# Patient Record
Sex: Male | Born: 1935 | Race: Black or African American | Hispanic: No | Marital: Married | Smoking: Current every day smoker
Health system: Southern US, Community
[De-identification: ages and names within clinical notes are randomized; demographics above are authoritative.]

## PROBLEM LIST (undated history)

## (undated) DIAGNOSIS — T7840XA Allergy, unspecified, initial encounter: Secondary | ICD-10-CM

## (undated) DIAGNOSIS — C61 Malignant neoplasm of prostate: Secondary | ICD-10-CM

## (undated) DIAGNOSIS — C7951 Secondary malignant neoplasm of bone: Secondary | ICD-10-CM

## (undated) DIAGNOSIS — Z9221 Personal history of antineoplastic chemotherapy: Secondary | ICD-10-CM

## (undated) DIAGNOSIS — J45909 Unspecified asthma, uncomplicated: Secondary | ICD-10-CM

## (undated) DIAGNOSIS — Z923 Personal history of irradiation: Secondary | ICD-10-CM

## (undated) DIAGNOSIS — E785 Hyperlipidemia, unspecified: Secondary | ICD-10-CM

## (undated) HISTORY — DX: Personal history of antineoplastic chemotherapy: Z92.21

## (undated) HISTORY — DX: Allergy, unspecified, initial encounter: T78.40XA

## (undated) HISTORY — DX: Personal history of irradiation: Z92.3

## (undated) HISTORY — PX: CHOLECYSTECTOMY: SHX55

## (undated) HISTORY — DX: Secondary malignant neoplasm of bone: C79.51

## (undated) HISTORY — DX: Hyperlipidemia, unspecified: E78.5

## (undated) HISTORY — PX: CATARACT EXTRACTION: SUR2

## (undated) HISTORY — DX: Unspecified asthma, uncomplicated: J45.909

## (undated) HISTORY — DX: Malignant neoplasm of prostate: C61

---

## 1999-08-09 ENCOUNTER — Emergency Department (HOSPITAL_COMMUNITY): Admission: EM | Admit: 1999-08-09 | Discharge: 1999-08-09 | Payer: Self-pay | Admitting: *Deleted

## 1999-12-12 HISTORY — PX: PROSTATE BIOPSY: SHX241

## 1999-12-12 HISTORY — PX: PERINEAL PROSTATECTOMY: SUR1054

## 2000-10-05 ENCOUNTER — Encounter (INDEPENDENT_AMBULATORY_CARE_PROVIDER_SITE_OTHER): Payer: Self-pay | Admitting: Specialist

## 2000-10-05 ENCOUNTER — Other Ambulatory Visit: Admission: RE | Admit: 2000-10-05 | Discharge: 2000-10-05 | Payer: Self-pay | Admitting: Urology

## 2000-11-07 ENCOUNTER — Encounter: Payer: Self-pay | Admitting: Urology

## 2000-11-07 ENCOUNTER — Encounter: Admission: RE | Admit: 2000-11-07 | Discharge: 2000-11-07 | Payer: Self-pay | Admitting: Urology

## 2000-11-13 ENCOUNTER — Inpatient Hospital Stay (HOSPITAL_COMMUNITY): Admission: RE | Admit: 2000-11-13 | Discharge: 2000-11-17 | Payer: Self-pay | Admitting: Urology

## 2000-11-13 ENCOUNTER — Encounter (INDEPENDENT_AMBULATORY_CARE_PROVIDER_SITE_OTHER): Payer: Self-pay | Admitting: Specialist

## 2003-04-07 ENCOUNTER — Ambulatory Visit (HOSPITAL_COMMUNITY): Admission: RE | Admit: 2003-04-07 | Discharge: 2003-04-07 | Payer: Self-pay | Admitting: Gastroenterology

## 2006-01-28 ENCOUNTER — Emergency Department (HOSPITAL_COMMUNITY): Admission: EM | Admit: 2006-01-28 | Discharge: 2006-01-28 | Payer: Self-pay | Admitting: Emergency Medicine

## 2007-04-22 ENCOUNTER — Encounter: Admission: RE | Admit: 2007-04-22 | Discharge: 2007-04-22 | Payer: Self-pay | Admitting: Urology

## 2007-04-26 ENCOUNTER — Ambulatory Visit: Admission: RE | Admit: 2007-04-26 | Discharge: 2007-07-24 | Payer: Self-pay | Admitting: Radiation Oncology

## 2010-07-12 ENCOUNTER — Encounter: Admission: RE | Admit: 2010-07-12 | Discharge: 2010-07-12 | Payer: Self-pay | Admitting: Orthopedic Surgery

## 2010-08-24 ENCOUNTER — Ambulatory Visit (HOSPITAL_COMMUNITY): Admission: RE | Admit: 2010-08-24 | Discharge: 2010-08-24 | Payer: Self-pay | Admitting: Urology

## 2011-01-01 ENCOUNTER — Encounter: Payer: Self-pay | Admitting: Urology

## 2011-04-28 NOTE — Op Note (Signed)
[facility]  Patient:    Aaron Schaefer, Aaron Schaefer                      MRN: [id number] Proc. Date: 11/13/00 Adm. Date:  [id number] Attending:  Lindaann Slough CC:         Winn Jock. Earl Gala, M.D.   Operative Report  PREOPERATIVE DIAGNOSIS:  Adenocarcinoma of prostate.  POSTOPERATIVE DIAGNOSIS:  Adenocarcinoma of prostate.  PROCEDURES: 1. Bilateral pelvic lymphadenectomy. 2. Radicle retropubic prostatectomy.  SURGEON:  Lindaann Slough, M.D.  ASSISTANT:  Sigmund I. Patsi Sears, M.D.  ANESTHESIA:  General.  INDICATIONS:  The patient is a 77 year old male who had an elevated PSA. Biopsy of the prostate was positive for adenocarcinoma.  The Gleason score was 6.  A bone scan was negative for metastatic disease.  Treatment options were discussed with the patient and his wife.  The risks and benefits of each option were discussed.  They chose a radical prostatectomy.  DESCRIPTION OF PROCEDURE:  Under general anesthesia, the patient was prepped and draped and placed in the supine position.  A 24 Foley catheter was inserted in the bladder.  Then a longitudinal incision was made from the symphysis to about 3 cm below the umbilicus.  The incision was carried down to the rectus fascia which was then incised.  The recti muscles were splint in the midline and the iliac fossae were entered.  Bilateral pelvic lymphadenectomy was done using external iliac vessels, the obturator nerve and vessels, the pelvic sidewall, and bladder landmarks.  Then the endopelvic fascia was incised from the apex to the base of the prostate.  Then the puboprostatic ligaments were incised.  A ______ was then passed behind the dorsal vein complex.  The dorsal vein complex was doubly ligated with 0 Vicryl. Then the dorsal vein complex was incised.  The anterior wall of the urethra was then incised.  The posterior wall of the urethra was bluntly dissected from the anterior wall of the rectum and  also incised.  The prostate was then bluntly and sharply dissected from the rectum.  The dissection was done close to the prostate in order to preserve the nerves.  Then the Denonvilliers fascia was incised.  The vas on the left side was freed from the surrounding tissues and ligated with 0 Vicryl and cut in between ligatures. The same procedure was done on the right side.  The seminal vesicles were then dissected from surrounding tissues.  The artery to the seminal vesicle was then ligated with hemoclips.  Then the anterior bladder neck was incised.  The Foley catheter was then pulled through the incision and the posterior bladder neck was incised.  The seminal vesicles were then dissected from the posterior wall of the bladder and the specimen was removed in toto.  Hemostasis was completed with suture ligatures.  Then the bladder neck admitted the tip of my index finger.  The mucosa of the bladder neck was then everted with 4-0 chromic.  Then five sutures of 2-0 Vicryl were passed through the urethra at the 2, the 5, the 6, the 7, and 10 oclock positions.  A Foley catheter was then passed through the urethra and into the bladder.  The urethral sutures were approximated to the bladder neck.  At the 10, the 2, the 5, the 6, and the 7 oclock positions.  The bladder was then irrigated with normal saline and there was no evidence of leak at the anastomosis.  The wound was  then irrigated with bug juice.  A Blake drain was placed in the wound and brought out through a separate stab wound.  The fascia was then closed with 2-0 PDS. The subcutaneous tissues were approximated with 3-0 Vicryl and the skin was closed with skin staples.  The sponge, needle, and instrument counts were correct on two occasions.  The estimated blood loss was 1200 cc.  The patient tolerated the procedure well and left the OR in satisfactory condition to the post anesthesia care unit. DD:  11/13/00 TD:  11/13/00 Job:  [id number]

## 2011-04-28 NOTE — Discharge Summary (Signed)
[facility]  Patient:    Aaron Schaefer, Aaron Schaefer                      MRN: [id number] Adm. Date:  [id number] Disc. Date: [id number] Attending:  Lindaann Slough                           Discharge Summary  DISCHARGE DIAGNOSES:  Adenocarcinoma of the prostate.  PROCEDURE:  Bilateral pelvic lymphadenectomy and radical retropubic prostatectomy on November 13, 2000.  HISTORY OF PRESENT ILLNESS:  The patient is a 77 year old male who was found no physical examination to have elevated PSA.  His PSA was 5.0.  He was then treated with Cipro for three weeks, and his repeat PSA was 4.96.  An ultrasound-guided biopsy of the prostate was positive for adenocarcinoma, Gleason score 6.  Bone scan is negative for metastatic disease.  The patient was admitted on November 13, 2000, for radical prostatectomy.  PHYSICAL EXAMINATION: VITAL SIGNS:  Blood pressure is 130/88, pulse 81, respirations 20 and temperature 97.  HEENT:  His head is normal.  Pupils are equal and reactive to light and accommodation. Ears, nose and throat are within normal limits.  NECK:  Supple.  No cervical lymph nodes.  No thyromegaly.  CHEST:  Symmetrical.  Lungs are fully expanded and clear to percussion and auscultation.  HEART:  Regular rhythm, no murmurs, no gallops.  ABDOMEN:  Soft, nondistended, nontender.  Liver, kidneys and spleen not palpable.  No organomegaly.  Bowel sounds are normal.  GENITALIA:  Penis is uncircumcised.  Meatus is normal.  Scrotum, testicles and epididymis are normal.  EXTREMITIES:  Within normal limits.  RECTAL:  Sphincter tone is normal.  His prostate is enlarged.  No nodules.  LABORATORY DATA:  Hemoglobin on admission is 14.0, hematocrit 42.1, WBC 6.5. Sodium is 141, potassium 3.6, glucose 122, BUN 13, creatinine 1.1.  Urinalysis is normal, and urine is sterile.  Chest x-ray showed borderline cardiomegaly.  The EKG showed normal sinus rhythm.  HOSPITAL COURSE:   The patient had a radical retropubic prostatectomy done on November 13, 2000.  His postoperative course was uneventful.  He remained afebrile.  He tolerated his diet well.  The Blake drain was draining decreasing amount of fluid.  The Foley catheter was draining clear urine.  The Harrison Mons drain was removed on the third day postoperatively, and he was discharged home on November 17, 2000.  DISCHARGE MEDICATIONS: 1. Bactrim DS one tablet p.o. twice a day. 2. Percocet 5/325 one tablet every three hours p.r.n. for pain.  DISCHARGE CONDITION:  Improved.  DISCHARGE DIET:  Regular.  DISCHARGE INSTRUCTIONS:  He is instructed not to do any lifting, straining, or driving until further advised.  FOLLOW-UP:  The patient will be seen in the office in one week for removal of the skin staples and the Foley catheter in two weeks. DD:  11/17/00 TD:  11/18/00 Job: [id number]

## 2011-04-28 NOTE — H&P (Signed)
[facility]  Patient:    Aaron Schaefer, Aaron Schaefer                      MRN: [id number] Adm. Date:  [id number] Attending:  Lindaann Slough                         History and Physical  CHIEF COMPLAINT:  Adenocarcinoma of prostate.  HISTORY OF PRESENT ILLNESS:  Patient is a 77 year old male who was found on physical examination to have an elevated PSA.  His PSA on August 03, 2000 was 5.0.  He was treated with Cipro for three weeks and his repeat PSA was 4.96. An ultrasound-guided biopsy of the prostate was positive for adenocarcinoma, Gleason score 6.  Bone scan is negative for metastatic disease.  Patient is now admitted for a radical prostatectomy.  PAST MEDICAL HISTORY:  No history of diabetes or hypertension.  FAMILY HISTORY:  His father died at age 91 of unknown causes to him.  His mother is 71 and is in good health.  There is a history of hypertension and diabetes in the family.  SOCIAL HISTORY:  He is married and has one child.  He has smoked about a pack a day for 30 years and drinks socially.  ALLERGIES:  No known drug allergies.  MEDICATIONS 1. Aspirin one tablet p.o. q.d. 2. Zyrtec p.r.n.  REVIEW OF SYSTEMS:  No cough.  No shortness of breath.  No hemoptysis. CARDIOVASCULAR:  No palpitations.  No chest pain.  GI:  No nausea.  No vomiting or diarrhea or constipation.  GU:  As per history.  PHYSICAL EXAMINATION  GENERAL:  This is a well-built 77 year old male in no acute distress.  VITAL SIGNS:  His blood pressure is 130/88, pulse 81, respirations 20 and temperature 97.  HEENT:  His head is normal.  Pupils are equal and reactive to light and accommodation.  Ears, nose and throat within normal limits.  NECK:  Supple.  No cervical lymph nodes.  No thyromegaly.  CHEST:  Symmetrical.  LUNGS:  Fully expanded and clear to percussion and auscultation.  HEART:  Regular rhythm.  No murmurs or gallops.  ABDOMEN:  Soft, nondistended and  nontender.  Liver, spleen and kidneys not palpable.  No organomegaly.  Bowel sounds normal.  GENITALIA:  Penis is normal.  The meatus is normal.  Scrotum, cords, testicles and epididymes are normal.  EXTREMITIES:  Within normal limits.  No pedal edema.  No deformities.  Good peripheral pulses.  RECTAL:  His sphincter tone is normal.  Prostate is enlarged, 2+, no nodules.  ADMITTING DIAGNOSIS:  Adenocarcinoma of prostate.  DD:  11/13/00 TD:  11/13/00 Job: [id number]

## 2011-07-31 ENCOUNTER — Inpatient Hospital Stay (INDEPENDENT_AMBULATORY_CARE_PROVIDER_SITE_OTHER)
Admission: RE | Admit: 2011-07-31 | Discharge: 2011-07-31 | Disposition: A | Discharge status: Home / Self Care | Payer: Medicare Other | Source: Ambulatory Visit | Attending: Family Medicine | Admitting: Family Medicine

## 2011-07-31 DIAGNOSIS — S0100XA Unspecified open wound of scalp, initial encounter: Secondary | ICD-10-CM

## 2011-08-07 ENCOUNTER — Inpatient Hospital Stay (HOSPITAL_COMMUNITY)
Admission: RE | Admit: 2011-08-07 | Discharge: 2011-08-07 | Disposition: A | Discharge status: Home / Self Care | Payer: Self-pay | Source: Ambulatory Visit | Attending: Family Medicine | Admitting: Family Medicine

## 2011-11-30 ENCOUNTER — Other Ambulatory Visit (HOSPITAL_COMMUNITY): Payer: Self-pay | Admitting: Urology

## 2011-11-30 DIAGNOSIS — C61 Malignant neoplasm of prostate: Secondary | ICD-10-CM

## 2011-12-12 DIAGNOSIS — C7951 Secondary malignant neoplasm of bone: Secondary | ICD-10-CM

## 2011-12-12 HISTORY — DX: Secondary malignant neoplasm of bone: C79.51

## 2011-12-22 ENCOUNTER — Encounter (HOSPITAL_COMMUNITY)
Admission: RE | Admit: 2011-12-22 | Discharge: 2011-12-22 | Disposition: A | Discharge status: Home / Self Care | Payer: Medicare Other | Source: Ambulatory Visit | Attending: Urology | Admitting: Urology

## 2011-12-22 ENCOUNTER — Other Ambulatory Visit (HOSPITAL_COMMUNITY): Payer: Self-pay | Admitting: Urology

## 2011-12-22 ENCOUNTER — Ambulatory Visit (HOSPITAL_COMMUNITY)
Admission: RE | Admit: 2011-12-22 | Discharge: 2011-12-22 | Disposition: A | Discharge status: Home / Self Care | Payer: Medicare Other | Source: Ambulatory Visit | Attending: Urology | Admitting: Urology

## 2011-12-22 DIAGNOSIS — C61 Malignant neoplasm of prostate: Secondary | ICD-10-CM | POA: Insufficient documentation

## 2011-12-22 MED ORDER — TECHNETIUM TC 99M MEDRONATE IV KIT
24.0000 | PACK | Freq: Once | INTRAVENOUS | Status: AC | PRN
  Administered 2011-12-22: 24 via INTRAVENOUS

## 2011-12-26 ENCOUNTER — Other Ambulatory Visit (HOSPITAL_COMMUNITY): Payer: Self-pay | Admitting: Urology

## 2011-12-26 DIAGNOSIS — C61 Malignant neoplasm of prostate: Secondary | ICD-10-CM

## 2012-01-01 ENCOUNTER — Ambulatory Visit (HOSPITAL_COMMUNITY)
Admission: RE | Admit: 2012-01-01 | Discharge: 2012-01-01 | Disposition: A | Discharge status: Home / Self Care | Payer: Medicare Other | Source: Ambulatory Visit | Attending: Urology | Admitting: Urology

## 2012-01-01 DIAGNOSIS — C61 Malignant neoplasm of prostate: Secondary | ICD-10-CM

## 2012-01-01 DIAGNOSIS — M5030 Other cervical disc degeneration, unspecified cervical region: Secondary | ICD-10-CM | POA: Insufficient documentation

## 2012-01-01 DIAGNOSIS — C7951 Secondary malignant neoplasm of bone: Secondary | ICD-10-CM | POA: Insufficient documentation

## 2012-01-01 DIAGNOSIS — C7952 Secondary malignant neoplasm of bone marrow: Secondary | ICD-10-CM | POA: Insufficient documentation

## 2012-01-01 DIAGNOSIS — M47812 Spondylosis without myelopathy or radiculopathy, cervical region: Secondary | ICD-10-CM | POA: Insufficient documentation

## 2012-01-01 LAB — CREATININE, SERUM: GFR calc Af Amer: 78 mL/min — ABNORMAL LOW (ref >90)

## 2012-01-01 MED ORDER — GADOBENATE DIMEGLUMINE 529 MG/ML IV SOLN
18.0000 mL | Freq: Once | INTRAVENOUS | Status: AC | PRN
  Administered 2012-01-01: 18 mL via INTRAVENOUS

## 2012-09-06 ENCOUNTER — Telehealth: Payer: Self-pay | Admitting: Oncology

## 2012-09-06 NOTE — Telephone Encounter (Signed)
S/W PT'S WIFE IN REF. TO NP APPT. ON 09/11/12 @10 :30 REFERRING DR. Eulah Pont [place]  MAILED NP PACKET

## 2012-09-09 ENCOUNTER — Telehealth: Payer: Self-pay | Admitting: Oncology

## 2012-09-09 NOTE — Telephone Encounter (Signed)
C/D 09/09/12 for appt 09/11/12 °

## 2012-09-11 ENCOUNTER — Encounter: Payer: Self-pay | Admitting: Oncology

## 2012-09-11 ENCOUNTER — Ambulatory Visit: Payer: Medicare Other

## 2012-09-11 ENCOUNTER — Telehealth: Payer: Self-pay | Admitting: Oncology

## 2012-09-11 ENCOUNTER — Other Ambulatory Visit: Payer: Self-pay | Admitting: Oncology

## 2012-09-11 ENCOUNTER — Ambulatory Visit (HOSPITAL_BASED_OUTPATIENT_CLINIC_OR_DEPARTMENT_OTHER): Payer: Medicare Other | Admitting: Oncology

## 2012-09-11 ENCOUNTER — Other Ambulatory Visit (HOSPITAL_BASED_OUTPATIENT_CLINIC_OR_DEPARTMENT_OTHER): Payer: Medicare Other | Admitting: Lab

## 2012-09-11 VITALS — BP 142/81 | HR 67 | Temp 97.3°F | Resp 20 | Ht 65.5 in | Wt 195.2 lb

## 2012-09-11 DIAGNOSIS — C61 Malignant neoplasm of prostate: Secondary | ICD-10-CM

## 2012-09-11 DIAGNOSIS — C7951 Secondary malignant neoplasm of bone: Secondary | ICD-10-CM

## 2012-09-11 DIAGNOSIS — C7952 Secondary malignant neoplasm of bone marrow: Secondary | ICD-10-CM

## 2012-09-11 LAB — CBC WITH DIFFERENTIAL/PLATELET
BASO%: 0.9 % (ref 0.0–2.0)
Eosinophils Absolute: 0.4 10*3/uL (ref 0.0–0.5)
HCT: 41.6 % (ref 38.4–49.9)
HGB: 13.5 g/dL (ref 13.0–17.1)
LYMPH%: 23.5 % (ref 14.0–49.0)
MCH: 29.9 pg (ref 27.2–33.4)
MONO%: 10.9 % (ref 0.0–14.0)
Platelets: 174 10*3/uL (ref 140–400)
RDW: 13.2 % (ref 11.0–14.6)

## 2012-09-11 LAB — COMPREHENSIVE METABOLIC PANEL (CC13)
ALT: 9 U/L (ref 0–55)
AST: 14 U/L (ref 5–34)
Albumin: 3.5 g/dL (ref 3.5–5.0)
Alkaline Phosphatase: 145 U/L (ref 40–150)
Glucose: 96 mg/dl (ref 70–99)
Potassium: 4.4 mEq/L (ref 3.5–5.1)
Sodium: 142 mEq/L (ref 136–145)
Total Bilirubin: 0.9 mg/dL (ref 0.20–1.20)
Total Protein: 6.8 g/dL (ref 6.4–8.3)

## 2012-09-11 MED ORDER — ONDANSETRON HCL 8 MG PO TABS: 8.0000 mg | ORAL_TABLET | Freq: Three times a day (TID) | ORAL | Status: DC | PRN

## 2012-09-11 NOTE — Progress Notes (Signed)
CC:   Theressa Millard, M.D. Lindaann Slough, M.D.  REASON FOR CONSULTATION:  Prostate cancer.  HISTORY OF PRESENT ILLNESS:  Aaron Schaefer is a pleasant 78 year old gentleman, native of a [place], lived in multiple areas, currently of [place].  He is a pleasant gentleman, currently works as a Education officer, environmental in [employer] with really no significant past medical history other than history of prostate cancer.  His prostate cancer dates back to 2001 where he presented with elevated PSA of 4.96.  His prostate biopsy on October of 2001 revealed that he had a Gleason score 3 + 3 = 6 adenocarcinoma.  The patient underwent a radical prostatectomy which pathology revealed a 3 + 4 = 7 with a pathological staging of T3a N0. His PSA remained undetectable until 2005, but he had increase up to 0.2 with a staging workup that was negative.  The patient received salvage radiation therapy, a total of 68.4 cGy.  His PSA remained stable in March of 2009 to up to 0.29.  It was then up to 0.66 in April of 2010 and in August of 2011 his PSA was up to 10 and then subsequently to 25 in December of 2011.  The patient started degarelix with excellent decline of his PSA down to 1.26 in March of 2012.  That subsequently was converted to Lupron and with PSA nadir down to 0.89 in June of 2012. That rose dramatically to 15.6 in December of 2012 and in January of 2013,  he had bony metastasis in the vertebra, involvement of T3, T4 and T5.  He also had a T6, left 5th rib and T8.  The patient was started on Casodex at that time between January 22nd to January 29th of 2013.  He was also started on Xgeva and vitamin D.  In February of 2013, he was evaluated for Provenge immunotherapy and received that under the care of Dr. Greggory Stallion at [facility].  The patient subsequently continued to have a rise in his PSA and was recently treated with Xtandi.  In his most recent staging workup done on September of 2013,  he continued to have persistent but unchanged osseous metastasis including the axial and appendicular skeleton and his PSA rose up to 42.6.  At that time, Diana Eves was stopped and suggestion of switching back to different agent such as Taxotere chemotherapy was suggested and [name] elected to have his chemotherapy done here at [facility].  For that reason, the patient was referred to me for evaluation.  Upon interviewing Aaron Schaefer, he is minimally symptomatic from his cancer. He does have some hip pain and some occasional shoulder pain.  He does not have hematuria but does have frequency in urination.  He had not had any hip pain, had not had any pathological fractures.  He continued to receive Xgeva.  He still had excellent performance status.  He still continued to be active in church and work full time.  He is able to drive and perform most activities of daily living without any hindrance or decline.  REVIEW OF SYSTEMS:  He does not report any headaches, blurry vision, double vision.  He does not report any motor or sensory neuropathy.  He does not report any alteration in mental status, psychiatric issues, depression.  He does not report any fever, chills, sweats.  He does not report any cough, hemoptysis, hematemesis.  No nausea, vomiting.  He does not report any abdominal pain.  No hematochezia, melena, genitourinary complaints.  He did report  pruritus and slight rash with possible recent exposure to poison ivy.  PAST MEDICAL HISTORY:  Significant for allergy, hyperlipidemia and prostate cancer.  He is status post gallbladder surgery, cataract surgery.  MEDICATIONS:  He is currently on simvastatin 80 mg.  He is on Benadryl and calcium supplement.  ALLERGIES:  None.  SOCIAL HISTORY:  He is married.  He has 1 son.  Smokes less than half pack a day and has done that for 30 years.  He works as a Optician, dispensing, still works full time.  FAMILY HISTORY:  There is no history of any  malignant disorders that he can recall.  None of the men in his family have prostate cancer.  PHYSICAL EXAMINATION:  Alert, awake gentleman, appeared in no active distress today.  Vital signs:  His blood pressure is 142/81, pulse 67, respiration 20, weighs 195 pounds.  ECOG performance status is 1. HEENT:  Head is normocephalic, atraumatic.  Pupils equal, round, reactive to light.  Oral mucosa moist and pink.  Neck:  Supple without adenopathy.  Heart:  Regular rate and rhythm, S1, S2.  Lungs:  Clear to auscultation without rhonchi, wheeze, dullness to percussion.  Abdomen: Soft, nontender.  No hepatosplenomegaly.  Extremities:  No clubbing, cyanosis, or edema.  Neurological:  Intact motor, sensory and deep tendon reflexes.  LABORATORY DATA:  Showed a hemoglobin of 13.5, white cell count 5.7, platelet count 174.  Chemistry showed a creatinine of 0.9, normal liver function tests.  ASSESSMENT AND PLAN:  This is a pleasant a 78 year old gentleman with the following issues: 1. Castration-resistant prostate cancer.  His diagnosis dates back to     2001.  He had a Gleason score 3 + 4 = 7, PSA 4.96.  His previous     treatments include status post prostatectomy followed by salvage     radiation therapy.  He was also treated with Lupron and Casodex,     progressed, developed castration-resistant disease in January of     2013 with bony metastasis.  He was treated with Provenge     immunotherapy and Xtandi and currently has progressed on all of     that.  Last PSA is around 20 with disease progression by imaging     studies.  I had a lengthy discussion today with Aaron Schaefer     discussing natural course of prostate cancer, more specifically     castration-resistant prostate cancer and the different treatment     options.  He has not been on Zytiga before or Taxotere or     cabazitaxel.  At this time, I do concur.  I think chemotherapy     would be his better choice at this time given his rapid  progression     on hormonal therapy.  Risks and benefits of Taxotere chemotherapy     were discussed today in details.  The benefit at this point     includes reduction in his PSA, palliation of symptoms, improvement     in quality of life as well as increased overall survival.  I have     quoted him about 50% decline in his PSA, about 50% chance of     achieving that.  Complications include nausea and vomiting,     myelosuppression, peripheral neuropathy, nail changes, infusion-     related toxicities, lower extremity swelling, pleural effusion.     Pericardial effusions are rare but certainly very serious.  I have     also talked to him about  complications of myelosuppression includes     neutropenia, neutropenic sepsis, possible need for intravenous     antibiotics and hospitalization, possible need for growth factor     support, and possible hospitalization or transfusions.  I also     talked to him about the logistics of administration intravenously     as well as his schedule.  I also talked to him about the need for     possible Port-A-Cath if his peripheral veins, which he prefers to     use, do not cooperate.  All his questions were answered today.  I     will also start him on Neulasta the day after chemotherapy.  He is     willing to proceed.  I will get that started in the near future     after chemotherapy education class.  I have also given him a     prescription for antiemetics with Zofran. 2. Bony disease.  He is currently on Xgeva and vitamin D.  His pain is     reasonably controlled with a nonsteroidal anti-inflammatory.  He     might need a stronger pain medication in the future and possible     palliative radiation therapy. 3. Androgen deprivation.  He will continue on Lupron under the care of     Dr. Brunilda Payor.    ______________________________ Benjiman Core, M.D. FNS/MEDQ  D:  09/11/2012  T:  09/11/2012  Job:  [id number]

## 2012-09-11 NOTE — Telephone Encounter (Signed)
appts made and printed for pt aom °

## 2012-09-11 NOTE — Progress Notes (Signed)
Note dictated

## 2012-09-11 NOTE — Progress Notes (Signed)
Checked in new pt with no financial concerns. °

## 2012-09-12 ENCOUNTER — Encounter: Payer: Self-pay | Admitting: *Deleted

## 2012-09-12 ENCOUNTER — Other Ambulatory Visit: Payer: BC Managed Care – PPO

## 2012-09-12 LAB — TESTOSTERONE: Testosterone: <10 ng/dL — ABNORMAL LOW (ref 300–890)

## 2012-09-16 ENCOUNTER — Ambulatory Visit (HOSPITAL_BASED_OUTPATIENT_CLINIC_OR_DEPARTMENT_OTHER): Payer: Medicare Other

## 2012-09-16 VITALS — BP 145/85 | HR 71 | Temp 98.0°F | Resp 16

## 2012-09-16 DIAGNOSIS — C61 Malignant neoplasm of prostate: Secondary | ICD-10-CM

## 2012-09-16 DIAGNOSIS — C7951 Secondary malignant neoplasm of bone: Secondary | ICD-10-CM

## 2012-09-16 DIAGNOSIS — Z5111 Encounter for antineoplastic chemotherapy: Secondary | ICD-10-CM

## 2012-09-16 MED ORDER — ONDANSETRON 8 MG/50ML IVPB (CHCC)
8.0000 mg | Freq: Once | INTRAVENOUS | Status: AC
  Administered 2012-09-16: 8 mg via INTRAVENOUS

## 2012-09-16 MED ORDER — DOCETAXEL CHEMO INJECTION 160 MG/16ML
75.0000 mg/m2 | Freq: Once | INTRAVENOUS | Status: AC
  Administered 2012-09-16: 150 mg via INTRAVENOUS
  Filled 2012-09-16: qty 15

## 2012-09-16 MED ORDER — DEXAMETHASONE SODIUM PHOSPHATE 10 MG/ML IJ SOLN
10.0000 mg | Freq: Once | INTRAMUSCULAR | Status: AC
  Administered 2012-09-16: 10 mg via INTRAVENOUS

## 2012-09-16 MED ORDER — SODIUM CHLORIDE 0.9 % IV SOLN
Freq: Once | INTRAVENOUS | Status: AC
  Administered 2012-09-16: 09:00:00 via INTRAVENOUS

## 2012-09-16 NOTE — Patient Instructions (Addendum)
[facility] Discharge Instructions for Patients Receiving Chemotherapy  Today you received the following chemotherapy agents Taxotere.  To help prevent nausea and vomiting after your treatment, we encourage you to take your nausea medication as ordered per MD.    If you develop nausea and vomiting that is not controlled by your nausea medication, call the clinic. If it is after clinic hours your family physician or the after hours number for the clinic or go to the Emergency Department.   BELOW ARE SYMPTOMS THAT SHOULD BE REPORTED IMMEDIATELY:  *FEVER GREATER THAN 100.5 F  *CHILLS WITH OR WITHOUT FEVER  NAUSEA AND VOMITING THAT IS NOT CONTROLLED WITH YOUR NAUSEA MEDICATION  *UNUSUAL SHORTNESS OF BREATH  *UNUSUAL BRUISING OR BLEEDING  TENDERNESS IN MOUTH AND THROAT WITH OR WITHOUT PRESENCE OF ULCERS  *URINARY PROBLEMS  *BOWEL PROBLEMS  UNUSUAL RASH Items with * indicate a potential emergency and should be followed up as soon as possible.  One of the nurses will contact you 24 hours after your treatment. Please let the nurse know about any problems that you may have experienced. Feel free to call the clinic you have any questions or concerns. The clinic phone number is [phone].   I have been informed and understand all the instructions given to me. I know to contact the clinic, my physician, or go to the Emergency Department if any problems should occur. I do not have any questions at this time, but understand that I may call the clinic during office hours   should I have any questions or need assistance in obtaining follow up care.    __________________________________________  _____________  __________ Signature of Patient or Authorized Representative            Date                   Time    __________________________________________ Nurse's Signature    

## 2012-09-17 ENCOUNTER — Telehealth: Payer: Self-pay | Admitting: *Deleted

## 2012-09-17 ENCOUNTER — Ambulatory Visit (HOSPITAL_BASED_OUTPATIENT_CLINIC_OR_DEPARTMENT_OTHER): Payer: Medicare Other

## 2012-09-17 VITALS — BP 126/74 | HR 76 | Temp 97.4°F

## 2012-09-17 DIAGNOSIS — C61 Malignant neoplasm of prostate: Secondary | ICD-10-CM

## 2012-09-17 MED ORDER — PEGFILGRASTIM INJECTION 6 MG/0.6ML
6.0000 mg | Freq: Once | SUBCUTANEOUS | Status: AC
  Administered 2012-09-17: 6 mg via SUBCUTANEOUS
  Filled 2012-09-17: qty 0.6

## 2012-09-17 NOTE — Telephone Encounter (Signed)
Patient here for Neulasta injection following 1st chemo treatment.  States that he is doing good  No nausea, vomiting, or diarrhea.  Is drinking lots of fluid, feels like he could 'float away'.  Knows to call if he has any problems, concerns or questions.

## 2012-09-23 ENCOUNTER — Telehealth: Payer: Self-pay | Admitting: *Deleted

## 2012-09-23 NOTE — Telephone Encounter (Signed)
Wife louise calling to say patient had diarrhea over the week-end and is not eating or drinking. They have imodium in the house, instructed her to give 2 tabs after each loose stool and force fluids and make him eat. She will call back 09-24-12, to up date dr Clelia Croft.

## 2012-10-02 ENCOUNTER — Encounter: Payer: Self-pay | Admitting: Dietician

## 2012-10-02 NOTE — Progress Notes (Signed)
Brief Out-patient Oncology Nutrition Note  Reason: Patient Screened Positive For Nutrition Risk For Unintentional Weight Loss and Decreased Appetite.   Aaron Schaefer is a 78 year old male patient of Dr. Clelia Croft, diagnosed with prostate cancer. Attempted to contact patient via telephone for positive nutrition risk. Left patient voicemail with RD contact information.  I have educated the patient on high calorie, high protein diet.   RD available for nutrition needs.   Iven Finn, MS, RD, LDN [phone]

## 2012-10-04 ENCOUNTER — Encounter: Payer: Self-pay | Admitting: *Deleted

## 2012-10-07 ENCOUNTER — Encounter: Payer: Self-pay | Admitting: Oncology

## 2012-10-07 ENCOUNTER — Other Ambulatory Visit (HOSPITAL_BASED_OUTPATIENT_CLINIC_OR_DEPARTMENT_OTHER): Payer: Medicare Other | Admitting: Lab

## 2012-10-07 ENCOUNTER — Ambulatory Visit (HOSPITAL_BASED_OUTPATIENT_CLINIC_OR_DEPARTMENT_OTHER): Payer: Medicare Other

## 2012-10-07 ENCOUNTER — Ambulatory Visit (HOSPITAL_BASED_OUTPATIENT_CLINIC_OR_DEPARTMENT_OTHER): Payer: Medicare Other | Admitting: Oncology

## 2012-10-07 VITALS — BP 135/82 | HR 89 | Temp 98.0°F | Resp 20 | Ht 65.5 in | Wt 192.3 lb

## 2012-10-07 VITALS — BP 131/82 | HR 78 | Temp 97.7°F

## 2012-10-07 DIAGNOSIS — Z5111 Encounter for antineoplastic chemotherapy: Secondary | ICD-10-CM

## 2012-10-07 DIAGNOSIS — C7952 Secondary malignant neoplasm of bone marrow: Secondary | ICD-10-CM

## 2012-10-07 DIAGNOSIS — C7951 Secondary malignant neoplasm of bone: Secondary | ICD-10-CM

## 2012-10-07 DIAGNOSIS — C61 Malignant neoplasm of prostate: Secondary | ICD-10-CM

## 2012-10-07 DIAGNOSIS — R5381 Other malaise: Secondary | ICD-10-CM

## 2012-10-07 LAB — COMPREHENSIVE METABOLIC PANEL (CC13)
AST: 14 U/L (ref 5–34)
Albumin: 3.4 g/dL — ABNORMAL LOW (ref 3.5–5.0)
Alkaline Phosphatase: 139 U/L (ref 40–150)
BUN: 9 mg/dL (ref 7.0–26.0)
Creatinine: 0.9 mg/dL (ref 0.7–1.3)
Glucose: 88 mg/dl (ref 70–99)
Potassium: 4.1 mEq/L (ref 3.5–5.1)

## 2012-10-07 LAB — CBC WITH DIFFERENTIAL/PLATELET
BASO%: 3.1 % — ABNORMAL HIGH (ref 0.0–2.0)
Basophils Absolute: 0.2 10*3/uL — ABNORMAL HIGH (ref 0.0–0.1)
EOS%: 0.2 % (ref 0.0–7.0)
HCT: 38.1 % — ABNORMAL LOW (ref 38.4–49.9)
HGB: 12 g/dL — ABNORMAL LOW (ref 13.0–17.1)
LYMPH%: 23.8 % (ref 14.0–49.0)
MCH: 28.6 pg (ref 27.2–33.4)
MCHC: 31.5 g/dL — ABNORMAL LOW (ref 32.0–36.0)
MCV: 90.9 fL (ref 79.3–98.0)
MONO%: 12.1 % (ref 0.0–14.0)
NEUT%: 60.8 % (ref 39.0–75.0)
Platelets: 225 10*3/uL (ref 140–400)
lymph#: 1.1 10*3/uL (ref 0.9–3.3)

## 2012-10-07 MED ORDER — SODIUM CHLORIDE 0.9 % IV SOLN
Freq: Once | INTRAVENOUS | Status: AC
  Administered 2012-10-07: 10:00:00 via INTRAVENOUS

## 2012-10-07 MED ORDER — DEXAMETHASONE SODIUM PHOSPHATE 10 MG/ML IJ SOLN
10.0000 mg | Freq: Once | INTRAMUSCULAR | Status: AC
  Administered 2012-10-07: 10 mg via INTRAVENOUS

## 2012-10-07 MED ORDER — DEXTROSE 5 % IV SOLN
75.0000 mg/m2 | Freq: Once | INTRAVENOUS | Status: AC
  Administered 2012-10-07: 150 mg via INTRAVENOUS
  Filled 2012-10-07: qty 15

## 2012-10-07 MED ORDER — ONDANSETRON 8 MG/50ML IVPB (CHCC)
8.0000 mg | Freq: Once | INTRAVENOUS | Status: AC
  Administered 2012-10-07: 8 mg via INTRAVENOUS

## 2012-10-07 NOTE — Progress Notes (Signed)
Hematology and Oncology Follow Up Visit  Aaron Schaefer [id number] 1933/10/11 78 y.o. 10/07/2012 12:45 PM Aaron Schaefer,Aaron Schaefer Aaron Schaefer, [name], [name],*   Principle Diagnosis: Castration resistant prostate cancer. Initial diagnosis was in 2001. Gleason score is 3+4 = 7. PSA was 4.96.  Prior Therapy:  1. S/P prostateectomy followed by radiation therapy at the time of diagnosis. 2. Started on degarelix on 11/21/10 due to a rising PSA. This was switched to Lupron in March 2012 due to pain at the injection site. 3. He developed bone mets at T3, T4, & T5 and was started on Casodex and Xgeva on 12/22/11. 4. Received Provenge 02/15/12 through 03/14/12. 5. He received Xtandi from June 2013 to September 2013.  Current therapy: Systemic chemotherapy with Taxotere started on 09/16/12. He is here for cycle 2 today. He remains on Lupron and Xgeva at [facility].  Interim History:  Aaron Schaefer is see today for routine follow-up with his wife. He received his first cycle of Taxotere 3 weeks ago. He has mild fatigue, but performance status is unchanged. He had intermittent diarrhea following the chemotherapy. He had to use Imodium 2-3 times, but the diarrhea has now resolved. He had intermittent nausea and vomited one time. Anti-emetics were overall effective for him. He notices that his hair is starting to come out. He has an area of dry skin to his left wrist near where he wears a watch. No other rashes. No pain. Denies chest pain, shortness of breath, abdominal pain. No urinary complaints. No hematuria.   Medications: I have reviewed the patient's current medications. Current outpatient prescriptions:calcium carbonate (OS-CAL) 600 MG TABS, Take 600 mg by mouth daily., Disp: , Rfl: ;  diphenhydrAMINE (BENADRYL) 25 MG tablet, Take 25 mg by mouth every 6 (six) hours as needed. As needed for itching, Disp: , Rfl: ;  ondansetron (ZOFRAN) 8 MG tablet, Take 1 tablet (8 mg total) by mouth every 8 (eight) hours as  needed for nausea., Disp: 20 tablet, Rfl: 0 simvastatin (ZOCOR) 80 MG tablet, Take 80 mg by mouth at bedtime., Disp: , Rfl:  No current facility-administered medications for this visit. Facility-Administered Medications Ordered in Other Visits: 0.9 %  sodium chloride infusion, , Intravenous, Once, Aaron Core, MD;  dexamethasone (DECADRON) injection 10 mg, 10 mg, Intravenous, Once, Aaron Core, MD, 10 mg at 10/07/12 1027;  DOCEtaxel (TAXOTERE) 150 mg in dextrose 5 % 250 mL chemo infusion, 75 mg/m2 (Treatment Plan Actual), Intravenous, Once, Aaron Core, MD, 150 mg at 10/07/12 1120 ondansetron (ZOFRAN) IVPB 8 mg, 8 mg, Intravenous, Once, Aaron Core, MD, 8 mg at 10/07/12 1027  Allergies: No Known Allergies  Past Medical History, Surgical history, Social history, and Family History were reviewed and updated.  Review of Systems: Constitutional:  Negative for fever, chills, night sweats, anorexia, weight loss, pain. Cardiovascular: no chest pain or dyspnea on exertion Respiratory: no cough, shortness of breath, or wheezing Neurological: no TIA or stroke symptoms Dermatological: negative ENT: negative Skin: Negative. Gastrointestinal: no abdominal pain, change in bowel habits, or black or bloody stools Genito-Urinary: no dysuria, trouble voiding, or hematuria Hematological and Lymphatic: negative Breast: negative for breast lumps Musculoskeletal: negative Remaining ROS negative.  Physical Exam: Blood pressure 135/82, pulse 89, temperature 98 F (36.7 C), temperature source Oral, resp. rate 20, height 5' 5.5" (1.664 m), weight 192 lb 4.8 oz (87.227 kg). ECOG: 0-1 General appearance: alert, cooperative and no distress Head: Normocephalic, without obvious abnormality, atraumatic Neck: no adenopathy, no carotid bruit, no  JVD, supple, symmetrical, trachea midline and thyroid not enlarged, symmetric, no tenderness/mass/nodules Lymph nodes: Cervical, supraclavicular, and axillary  nodes normal. Heart:regular rate and rhythm, S1, S2 normal, no murmur, click, rub or gallop Lung:chest clear, no wheezing, rales, normal symmetric air entry, no tachypnea, retractions or cyanosis Abdomen: soft, non-tender, without masses or organomegaly EXT:no erythema, induration, or nodules   Lab Results: Lab Results  Component Value Date   WBC 4.8 10/07/2012   HGB 12.0* 10/07/2012   HCT 38.1* 10/07/2012   MCV 90.9 10/07/2012   PLT 225 10/07/2012     Chemistry      Component Value Date/Time   NA 143 10/07/2012 0854   K 4.1 10/07/2012 0854   CL 109* 10/07/2012 0854   CO2 25 10/07/2012 0854   BUN 9.0 10/07/2012 0854   CREATININE 0.9 10/07/2012 0854   CREATININE 1.05 01/01/2012 0923      Component Value Date/Time   CALCIUM 9.1 10/07/2012 0854   ALKPHOS 139 10/07/2012 0854   AST 14 10/07/2012 0854   ALT 13 10/07/2012 0854   BILITOT 0.70 10/07/2012 0854      Impression and Plan: This is a 78 year old gentleman with the following issues:  1. Castration resistant prostate cancer. He is currently on Taxotere and is overall tolerating this well. Recommend that he proceed with cycle 2 of his chemotherapy today without dose modification. PSA is pending today.  2. Bony disease. He remains on Xgeva monthly at [facility].  3. Androgen deprivation. He remains on Lupron at [facility].  4. Follow-up. In 3 weeks for cycle 3 of chemotherapy.   Spent more than half the time coordinating care.    Annalysa Schaefer 10/28/201312:45 PM

## 2012-10-07 NOTE — Patient Instructions (Addendum)
Results for Aaron Schaefer, Aaron Schaefer (MRN [id number]) as of 10/07/2012 09:40  Ref. Range 09/11/2012 10:46  PSA Latest Range: <=4.00 ng/mL 50.68 (H)

## 2012-10-07 NOTE — Patient Instructions (Signed)
[facility] Discharge Instructions for Patients Receiving Chemotherapy  Today you received the following chemotherapy agents Taxotere  To help prevent nausea and vomiting after your treatment, we encourage you to take your nausea medication as prescribed.   If you develop nausea and vomiting that is not controlled by your nausea medication, call the clinic. If it is after clinic hours your family physician or the after hours number for the clinic or go to the Emergency Department.   BELOW ARE SYMPTOMS THAT SHOULD BE REPORTED IMMEDIATELY:  *FEVER GREATER THAN 100.5 F  *CHILLS WITH OR WITHOUT FEVER  NAUSEA AND VOMITING THAT IS NOT CONTROLLED WITH YOUR NAUSEA MEDICATION  *UNUSUAL SHORTNESS OF BREATH  *UNUSUAL BRUISING OR BLEEDING  TENDERNESS IN MOUTH AND THROAT WITH OR WITHOUT PRESENCE OF ULCERS  *URINARY PROBLEMS  *BOWEL PROBLEMS  UNUSUAL RASH Items with * indicate a potential emergency and should be followed up as soon as possible.  Feel free to call the clinic you have any questions or concerns. The clinic phone number is [phone].   I have been informed and understand all the instructions given to me. I know to contact the clinic, my physician, or go to the Emergency Department if any problems should occur. I do not have any questions at this time, but understand that I may call the clinic during office hours   should I have any questions or need assistance in obtaining follow up care.  

## 2012-10-08 ENCOUNTER — Ambulatory Visit: Payer: Medicare Other | Admitting: Nutrition

## 2012-10-08 ENCOUNTER — Ambulatory Visit (HOSPITAL_BASED_OUTPATIENT_CLINIC_OR_DEPARTMENT_OTHER): Payer: Medicare Other

## 2012-10-08 VITALS — BP 121/74 | HR 84 | Temp 97.1°F

## 2012-10-08 DIAGNOSIS — C61 Malignant neoplasm of prostate: Secondary | ICD-10-CM

## 2012-10-08 MED ORDER — PEGFILGRASTIM INJECTION 6 MG/0.6ML
6.0000 mg | Freq: Once | SUBCUTANEOUS | Status: AC
  Administered 2012-10-08: 6 mg via SUBCUTANEOUS
  Filled 2012-10-08: qty 0.6

## 2012-10-08 NOTE — Progress Notes (Signed)
Patient is a 78 year old male diagnosed with prostate cancer.  Past medical history includes hyperlipidemia and tobacco usage.  Medications include Os-Cal, Zofran, and Zocor.  Labs were reviewed.  Height: 65 inches. Weight: 195.2 pounds.  BMI: 31.98.  Patient and wife would like information on healthy diet and easy food preparation. Patient reports taste alterations throughout chemotherapy. He states he's never been a big eater. He did experience nausea and diarrhea 1 time, however, he feels this was food poisoning rather than a side effect from chemotherapy. Dietary recall reveals patient does eat a variety of fruits, vegetables and protein foods. He doesn't like foods with lots of seasoning.  Nutrition diagnosis: Food and nutrition related knowledge deficit related to new diagnosis of prostate cancer and associated treatments as evidenced by no prior need for nutrition related information.  Intervention: I educated patient and wife on strategies for consuming small healthy portions of food throughout the day. Using dietary recall of foods patient enjoys, I helped him to come up with meals and snacks that he would enjoy. I've educated him on strategies for dealing with taste alterations. I provided fact sheets my contact information. I've answered all his questions.  Monitoring, evaluation, and goals: Patient will tolerate a healthy plant-based diet to minimize weight gain throughout treatment.  Next visit: Patient will contact me if he has questions or concerns.

## 2012-10-29 ENCOUNTER — Ambulatory Visit (HOSPITAL_BASED_OUTPATIENT_CLINIC_OR_DEPARTMENT_OTHER): Payer: Medicare Other

## 2012-10-29 ENCOUNTER — Other Ambulatory Visit (HOSPITAL_BASED_OUTPATIENT_CLINIC_OR_DEPARTMENT_OTHER): Payer: Medicare Other

## 2012-10-29 ENCOUNTER — Ambulatory Visit (HOSPITAL_BASED_OUTPATIENT_CLINIC_OR_DEPARTMENT_OTHER): Payer: Medicare Other | Admitting: Oncology

## 2012-10-29 VITALS — BP 133/86 | HR 89 | Temp 97.0°F | Resp 20 | Ht 65.5 in | Wt 191.7 lb

## 2012-10-29 DIAGNOSIS — Z5111 Encounter for antineoplastic chemotherapy: Secondary | ICD-10-CM

## 2012-10-29 DIAGNOSIS — C61 Malignant neoplasm of prostate: Secondary | ICD-10-CM

## 2012-10-29 DIAGNOSIS — C7952 Secondary malignant neoplasm of bone marrow: Secondary | ICD-10-CM

## 2012-10-29 DIAGNOSIS — C7951 Secondary malignant neoplasm of bone: Secondary | ICD-10-CM

## 2012-10-29 DIAGNOSIS — E291 Testicular hypofunction: Secondary | ICD-10-CM

## 2012-10-29 LAB — CBC WITH DIFFERENTIAL/PLATELET
Basophils Absolute: 0.1 10*3/uL (ref 0.0–0.1)
Eosinophils Absolute: 0 10*3/uL (ref 0.0–0.5)
HGB: 11.8 g/dL — ABNORMAL LOW (ref 13.0–17.1)
MONO#: 0.8 10*3/uL (ref 0.1–0.9)
NEUT#: 2.1 10*3/uL (ref 1.5–6.5)
Platelets: 203 10*3/uL (ref 140–400)
RBC: 4.07 10*6/uL — ABNORMAL LOW (ref 4.20–5.82)
RDW: 14 % (ref 11.0–14.6)
WBC: 4.3 10*3/uL (ref 4.0–10.3)
nRBC: 0 % (ref 0–0)

## 2012-10-29 LAB — COMPREHENSIVE METABOLIC PANEL (CC13)
ALT: 9 U/L (ref 0–55)
Albumin: 3 g/dL — ABNORMAL LOW (ref 3.5–5.0)
CO2: 27 mEq/L (ref 22–29)
Calcium: 8.7 mg/dL (ref 8.4–10.4)
Chloride: 108 mEq/L — ABNORMAL HIGH (ref 98–107)
Glucose: 101 mg/dl — ABNORMAL HIGH (ref 70–99)
Sodium: 141 mEq/L (ref 136–145)
Total Protein: 6.2 g/dL — ABNORMAL LOW (ref 6.4–8.3)

## 2012-10-29 LAB — PSA: PSA: 28 ng/mL — ABNORMAL HIGH (ref <=4.00)

## 2012-10-29 MED ORDER — ONDANSETRON 8 MG/50ML IVPB (CHCC)
8.0000 mg | Freq: Once | INTRAVENOUS | Status: AC
  Administered 2012-10-29: 8 mg via INTRAVENOUS

## 2012-10-29 MED ORDER — DOCETAXEL CHEMO INJECTION 160 MG/16ML
75.0000 mg/m2 | Freq: Once | INTRAVENOUS | Status: AC
  Administered 2012-10-29: 150 mg via INTRAVENOUS
  Filled 2012-10-29: qty 15

## 2012-10-29 MED ORDER — DEXAMETHASONE SODIUM PHOSPHATE 10 MG/ML IJ SOLN
10.0000 mg | Freq: Once | INTRAMUSCULAR | Status: AC
  Administered 2012-10-29: 10 mg via INTRAVENOUS

## 2012-10-29 MED ORDER — SODIUM CHLORIDE 0.9 % IV SOLN
Freq: Once | INTRAVENOUS | Status: AC
  Administered 2012-10-29: 15:00:00 via INTRAVENOUS

## 2012-10-29 NOTE — Patient Instructions (Addendum)
[facility] Discharge Instructions for Patients Receiving Chemotherapy  Today you received the following chemotherapy agents; Taxotere.  To help prevent nausea and vomiting after your treatment, we encourage you to take your nausea medication;  Zofran (ondansetron) as directed.   If you develop nausea and vomiting that is not controlled by your nausea medication, call the clinic. If it is after clinic hours your family physician or the after hours number for the clinic or go to the Emergency Department.   BELOW ARE SYMPTOMS THAT SHOULD BE REPORTED IMMEDIATELY:  *FEVER GREATER THAN 100.5 F  *CHILLS WITH OR WITHOUT FEVER  NAUSEA AND VOMITING THAT IS NOT CONTROLLED WITH YOUR NAUSEA MEDICATION  *UNUSUAL SHORTNESS OF BREATH  *UNUSUAL BRUISING OR BLEEDING  TENDERNESS IN MOUTH AND THROAT WITH OR WITHOUT PRESENCE OF ULCERS  *URINARY PROBLEMS  *BOWEL PROBLEMS  UNUSUAL RASH Items with * indicate a potential emergency and should be followed up as soon as possible.   Feel free to call the clinic you have any questions or concerns. The clinic phone number is [phone].   I have been informed and understand all the instructions given to me. I know to contact the clinic, my physician, or go to the Emergency Department if any problems should occur. I do not have any questions at this time, but understand that I may call the clinic during office hours   should I have any questions or need assistance in obtaining follow up care.    __________________________________________  _____________  __________ Signature of Patient or Authorized Representative            Date                   Time    __________________________________________ Nurse's Signature

## 2012-10-29 NOTE — Progress Notes (Signed)
Hematology and Oncology Follow Up Visit  JAHMIRE KOH [id number] 25-Dec-1932 78 y.o. 10/29/2012 1:58 PM [name],[name], [name], [name],*   Principle Diagnosis: 78 year old with castration resistant prostate cancer. Initial diagnosis was in 2001. Gleason score is 3+4 = 7. PSA was 4.96.  Prior Therapy:  1. S/P prostateectomy followed by radiation therapy at the time of diagnosis. 2. Started on degarelix on 11/21/10 due to a rising PSA. This was switched to Lupron in March 2012 due to pain at the injection site. 3. He developed bone mets at T3, T4, & T5 and was started on Casodex and Xgeva on 12/22/11. 4. Received Provenge 02/15/12 through 03/14/12. 5. He received Xtandi from June 2013 to September 2013.  Current therapy: Systemic chemotherapy with Taxotere started on 09/16/12. He is here for cycle 3 today. He remains on Lupron and Xgeva at [facility].  Interim History:  Mr Terrana is see today for routine follow-up with his wife. He received his second cycle of Taxotere 3 weeks ago. He has mild fatigue, but performance status is unchanged. He had intermittent diarrhea following the chemotherapy. He had to use Imodium 2-3 times, but the diarrhea has now resolved. He had intermittent nausea and vomited one time. Anti-emetics were overall effective for him. He has an area of dry skin to his left wrist near where he wears a watch which has improved. No other rashes. No pain. Denies chest pain, shortness of breath, abdominal pain. No urinary complaints. No hematuria. He reports that his taste is affected and have lost few pounds.  Medications: I have reviewed the patient's current medications. Current outpatient prescriptions:calcium carbonate (OS-CAL) 600 MG TABS, Take 600 mg by mouth daily., Disp: , Rfl: ;  diphenhydrAMINE (BENADRYL) 25 MG tablet, Take 25 mg by mouth every 6 (six) hours as needed. As needed for itching, Disp: , Rfl: ;  ondansetron (ZOFRAN) 8 MG tablet, Take 1 tablet  (8 mg total) by mouth every 8 (eight) hours as needed for nausea., Disp: 20 tablet, Rfl: 0 simvastatin (ZOCOR) 80 MG tablet, Take 80 mg by mouth at bedtime., Disp: , Rfl:   Allergies: No Known Allergies  Past Medical History, Surgical history, Social history, and Family History were reviewed and updated.  Review of Systems: Constitutional:  Negative for fever, chills, night sweats, anorexia, weight loss, pain. Cardiovascular: no chest pain or dyspnea on exertion Respiratory: no cough, shortness of breath, or wheezing Neurological: no TIA or stroke symptoms Dermatological: negative ENT: negative Skin: Negative. Gastrointestinal: no abdominal pain, change in bowel habits, or black or bloody stools Genito-Urinary: no dysuria, trouble voiding, or hematuria Hematological and Lymphatic: negative Breast: negative for breast lumps Musculoskeletal: negative Remaining ROS negative.  Physical Exam: Blood pressure 133/86, pulse 89, temperature 97 F (36.1 C), temperature source Oral, resp. rate 20, height 5' 5.5" (1.664 m), weight 191 lb 11.2 oz (86.955 kg). ECOG: 0-1 General appearance: alert, cooperative and no distress Head: Normocephalic, without obvious abnormality, atraumatic Neck: no adenopathy, no carotid bruit, no JVD, supple, symmetrical, trachea midline and thyroid not enlarged, symmetric, no tenderness/mass/nodules Lymph nodes: Cervical, supraclavicular, and axillary nodes normal. Heart:regular rate and rhythm, S1, S2 normal, no murmur, click, rub or gallop Lung:chest clear, no wheezing, rales, normal symmetric air entry, no tachypnea, retractions or cyanosis Abdomen: soft, non-tender, without masses or organomegaly EXT:no erythema, induration, or nodules   Lab Results: Lab Results  Component Value Date   WBC 4.3 10/29/2012   HGB 11.8* 10/29/2012   HCT 37.8* 10/29/2012  MCV 92.9 10/29/2012   PLT 203 10/29/2012     Chemistry      Component Value Date/Time   NA 143  10/07/2012 0854   K 4.1 10/07/2012 0854   CL 109* 10/07/2012 0854   CO2 25 10/07/2012 0854   BUN 9.0 10/07/2012 0854   CREATININE 0.9 10/07/2012 0854   CREATININE 1.05 01/01/2012 0923      Component Value Date/Time   CALCIUM 9.1 10/07/2012 0854   ALKPHOS 139 10/07/2012 0854   AST 14 10/07/2012 0854   ALT 13 10/07/2012 0854   BILITOT 0.70 10/07/2012 0854     Results for Penaloza, Arlie A (MRN [id number]) as of 10/29/2012 13:15  Ref. Range 09/11/2012 10:46 10/07/2012 08:54  PSA Latest Range: <=4.00 ng/mL 50.68 (H) 44.32 (H)   Impression and Plan: This is a 78 year old gentleman with the following issues:  1. Castration resistant prostate cancer. He is currently on Taxotere and is overall tolerating this well. Recommend that he proceed with cycle 3 of his chemotherapy today without dose modification. PSA has dropped after the first cycle of chemotherapy to 44.32.  2. Bony disease. He remains on Xgeva monthly at [facility].  3. Androgen deprivation. He remains on Lupron at [facility].  4. Follow-up. In 3 weeks for cycle 4 of chemotherapy.     Harlan Ervine 11/19/20131:58 PM

## 2012-10-30 ENCOUNTER — Telehealth: Payer: Self-pay | Admitting: Oncology

## 2012-10-30 ENCOUNTER — Telehealth: Payer: Self-pay | Admitting: *Deleted

## 2012-10-30 ENCOUNTER — Ambulatory Visit (HOSPITAL_BASED_OUTPATIENT_CLINIC_OR_DEPARTMENT_OTHER): Payer: Medicare Other

## 2012-10-30 VITALS — BP 124/78 | HR 90 | Temp 97.2°F

## 2012-10-30 DIAGNOSIS — C61 Malignant neoplasm of prostate: Secondary | ICD-10-CM

## 2012-10-30 DIAGNOSIS — C7952 Secondary malignant neoplasm of bone marrow: Secondary | ICD-10-CM

## 2012-10-30 MED ORDER — PEGFILGRASTIM INJECTION 6 MG/0.6ML
6.0000 mg | Freq: Once | SUBCUTANEOUS | Status: AC
  Administered 2012-10-30: 6 mg via SUBCUTANEOUS
  Filled 2012-10-30: qty 0.6

## 2012-10-30 NOTE — Telephone Encounter (Signed)
S/w wife re appt for 12/10 and pt will get schedule when he comes in.

## 2012-10-30 NOTE — Telephone Encounter (Signed)
lvm for pt regarding to 11.20.13 appt....and advised him to come by on visit day to get Dec Schedule.

## 2012-10-30 NOTE — Telephone Encounter (Signed)
Per staff message and POF I have scheduled appts.  JMW  

## 2012-10-30 NOTE — Telephone Encounter (Signed)
Emailed michelle to add chemo for this pt.

## 2012-11-19 ENCOUNTER — Other Ambulatory Visit (HOSPITAL_BASED_OUTPATIENT_CLINIC_OR_DEPARTMENT_OTHER): Payer: Medicare Other | Admitting: Lab

## 2012-11-19 ENCOUNTER — Ambulatory Visit (HOSPITAL_BASED_OUTPATIENT_CLINIC_OR_DEPARTMENT_OTHER): Payer: Medicare Other

## 2012-11-19 ENCOUNTER — Ambulatory Visit (HOSPITAL_BASED_OUTPATIENT_CLINIC_OR_DEPARTMENT_OTHER): Payer: Medicare Other | Admitting: Oncology

## 2012-11-19 ENCOUNTER — Encounter: Payer: Self-pay | Admitting: Oncology

## 2012-11-19 VITALS — BP 107/71 | HR 93 | Temp 97.0°F | Resp 20 | Ht 65.5 in | Wt 187.8 lb

## 2012-11-19 DIAGNOSIS — C61 Malignant neoplasm of prostate: Secondary | ICD-10-CM

## 2012-11-19 DIAGNOSIS — Z5111 Encounter for antineoplastic chemotherapy: Secondary | ICD-10-CM

## 2012-11-19 DIAGNOSIS — C7952 Secondary malignant neoplasm of bone marrow: Secondary | ICD-10-CM

## 2012-11-19 DIAGNOSIS — D6481 Anemia due to antineoplastic chemotherapy: Secondary | ICD-10-CM

## 2012-11-19 DIAGNOSIS — C7951 Secondary malignant neoplasm of bone: Secondary | ICD-10-CM

## 2012-11-19 LAB — CBC WITH DIFFERENTIAL/PLATELET
BASO%: 3.5 % — ABNORMAL HIGH (ref 0.0–2.0)
Basophils Absolute: 0.1 10e3/uL (ref 0.0–0.1)
EOS%: 0.6 % (ref 0.0–7.0)
Eosinophils Absolute: 0 10e3/uL (ref 0.0–0.5)
HCT: 34.6 % — ABNORMAL LOW (ref 38.4–49.9)
HGB: 10.8 g/dL — ABNORMAL LOW (ref 13.0–17.1)
LYMPH%: 33.6 % (ref 14.0–49.0)
MCH: 28.8 pg (ref 27.2–33.4)
MCHC: 31.2 g/dL — ABNORMAL LOW (ref 32.0–36.0)
MCV: 92.3 fL (ref 79.3–98.0)
MONO#: 0.6 10e3/uL (ref 0.1–0.9)
MONO%: 17 % — ABNORMAL HIGH (ref 0.0–14.0)
NEUT#: 1.6 10e3/uL (ref 1.5–6.5)
NEUT%: 45.3 % (ref 39.0–75.0)
Platelets: 207 10e3/uL (ref 140–400)
RBC: 3.75 10e6/uL — ABNORMAL LOW (ref 4.20–5.82)
RDW: 14.9 % — ABNORMAL HIGH (ref 11.0–14.6)
WBC: 3.4 10e3/uL — ABNORMAL LOW (ref 4.0–10.3)
lymph#: 1.2 10e3/uL (ref 0.9–3.3)
nRBC: 0 % (ref 0–0)

## 2012-11-19 LAB — COMPREHENSIVE METABOLIC PANEL (CC13)
ALT: 9 U/L (ref 0–55)
BUN: 9 mg/dL (ref 7.0–26.0)
CO2: 28 mEq/L (ref 22–29)
Calcium: 8.8 mg/dL (ref 8.4–10.4)
Chloride: 109 mEq/L — ABNORMAL HIGH (ref 98–107)
Creatinine: 0.9 mg/dL (ref 0.7–1.3)
Glucose: 100 mg/dl — ABNORMAL HIGH (ref 70–99)
Total Bilirubin: 0.98 mg/dL (ref 0.20–1.20)

## 2012-11-19 LAB — PSA: PSA: 23.32 ng/mL — ABNORMAL HIGH (ref <=4.00)

## 2012-11-19 MED ORDER — DOCETAXEL CHEMO INJECTION 160 MG/16ML
75.0000 mg/m2 | Freq: Once | INTRAVENOUS | Status: AC
  Administered 2012-11-19: 150 mg via INTRAVENOUS
  Filled 2012-11-19: qty 15

## 2012-11-19 MED ORDER — DEXAMETHASONE SODIUM PHOSPHATE 10 MG/ML IJ SOLN
10.0000 mg | Freq: Once | INTRAMUSCULAR | Status: AC
  Administered 2012-11-19: 10 mg via INTRAVENOUS

## 2012-11-19 MED ORDER — SODIUM CHLORIDE 0.9 % IV SOLN
Freq: Once | INTRAVENOUS | Status: AC
  Administered 2012-11-19: 13:00:00 via INTRAVENOUS

## 2012-11-19 MED ORDER — ONDANSETRON 8 MG/50ML IVPB (CHCC)
8.0000 mg | Freq: Once | INTRAVENOUS | Status: AC
  Administered 2012-11-19: 8 mg via INTRAVENOUS

## 2012-11-19 NOTE — Progress Notes (Signed)
Hematology and Oncology Follow Up Visit  Aaron Schaefer [id number] [birth year] 78 y.o. 11/19/2012 3:10 PM [name],[name], [name], [name],*   Principle Diagnosis: 78 year old with castration resistant prostate cancer. Initial diagnosis was in 2001. Gleason score is 3+4 = 7. PSA was 4.96.  Prior Therapy:  1. S/P prostateectomy followed by radiation therapy at the time of diagnosis. 2. Started on degarelix on 11/21/10 due to a rising PSA. This was switched to Lupron in March 2012 due to pain at the injection site. 3. He developed bone mets at T3, T4, & T5 and was started on Casodex and Xgeva on 12/22/11. 4. Received Provenge 02/15/12 through 03/14/12. 5. He received Xtandi from June 2013 to September 2013.  Current therapy: Systemic chemotherapy with Taxotere started on 09/16/12. He is here for cycle 4 today. He remains on Lupron and Xgeva at [facility].  Interim History:  Aaron Schaefer is see today for routine follow-up with his wife. He received his third cycle of Taxotere 3 weeks ago. He has mild fatigue, but performance status is unchanged. He had intermittent diarrhea following the chemotherapy. He had to use Imodium 2-3 times, but the diarrhea has now resolved. He had intermittent nausea and vomited one time. Anti-emetics were overall effective for him. He reports dry skin to the soles of his feet. No other rashes. No pain. Denies chest pain, shortness of breath, abdominal pain. No urinary complaints. No hematuria. He reports that his taste is affected and have lost few pounds. No neuropathy.  Medications: I have reviewed the patient's current medications. Current outpatient prescriptions:loperamide (IMODIUM) 2 MG capsule, Take 2 mg by mouth as needed., Disp: , Rfl: ;  calcium carbonate (OS-CAL) 600 MG TABS, Take 600 mg by mouth daily., Disp: , Rfl: ;  diphenhydrAMINE (BENADRYL) 25 MG tablet, Take 25 mg by mouth every 6 (six) hours as needed. As needed for itching, Disp: , Rfl:   ondansetron (ZOFRAN) 8 MG tablet, Take 1 tablet (8 mg total) by mouth every 8 (eight) hours as needed for nausea., Disp: 20 tablet, Rfl: 0;  simvastatin (ZOCOR) 80 MG tablet, Take 80 mg by mouth at bedtime., Disp: , Rfl:  No current facility-administered medications for this visit. Facility-Administered Medications Ordered in Other Visits: [COMPLETED] 0.9 %  sodium chloride infusion, , Intravenous, Once, Benjiman Core, MD, Last Rate: 20 mL/hr at 11/19/12 1320;  [COMPLETED] dexamethasone (DECADRON) injection 10 mg, 10 mg, Intravenous, Once, Benjiman Core, MD, 10 mg at 11/19/12 1324 [COMPLETED] DOCEtaxel (TAXOTERE) 150 mg in dextrose 5 % 250 mL chemo infusion, 75 mg/m2 (Treatment Plan Actual), Intravenous, Once, Benjiman Core, MD, Last Rate: 265 mL/hr at 11/19/12 1403, 150 mg at 11/19/12 1403;  [COMPLETED] ondansetron (ZOFRAN) IVPB 8 mg, 8 mg, Intravenous, Once, Benjiman Core, MD, 8 mg at 11/19/12 1324  Allergies: No Known Allergies  Past Medical History, Surgical history, Social history, and Family History were reviewed and updated.  Review of Systems: Constitutional:  Negative for fever, chills, night sweats, anorexia, weight loss, pain. Cardiovascular: no chest pain or dyspnea on exertion Respiratory: no cough, shortness of breath, or wheezing Neurological: no TIA or stroke symptoms Dermatological: negative ENT: negative Skin: Negative. Gastrointestinal: no abdominal pain, change in bowel habits, or black or bloody stools Genito-Urinary: no dysuria, trouble voiding, or hematuria Hematological and Lymphatic: negative Breast: negative for breast lumps Musculoskeletal: negative Remaining ROS negative.  Physical Exam: Blood pressure 107/71, pulse 93, temperature 97 F (36.1 C), temperature source Oral, resp. rate  20, height 5' 5.5" (1.664 m), weight 187 lb 12.8 oz (85.186 kg). ECOG: 0-1 General appearance: alert, cooperative and no distress Head: Normocephalic, without obvious  abnormality, atraumatic Neck: no adenopathy, no carotid bruit, no JVD, supple, symmetrical, trachea midline and thyroid not enlarged, symmetric, no tenderness/mass/nodules Lymph nodes: Cervical, supraclavicular, and axillary nodes normal. Heart:regular rate and rhythm, S1, S2 normal, no murmur, click, rub or gallop Lung:chest clear, no wheezing, rales, normal symmetric air entry, no tachypnea, retractions or cyanosis Abdomen: soft, non-tender, without masses or organomegaly EXT:no erythema, induration, or nodules   Lab Results: Lab Results  Component Value Date   WBC 3.4* 11/19/2012   HGB 10.8* 11/19/2012   HCT 34.6* 11/19/2012   MCV 92.3 11/19/2012   PLT 207 11/19/2012     Chemistry      Component Value Date/Time   NA 143 11/19/2012 1117   K 4.4 11/19/2012 1117   CL 109* 11/19/2012 1117   CO2 28 11/19/2012 1117   BUN 9.0 11/19/2012 1117   CREATININE 0.9 11/19/2012 1117   CREATININE 1.05 01/01/2012 0923      Component Value Date/Time   CALCIUM 8.8 11/19/2012 1117   ALKPHOS 119 11/19/2012 1117   AST 18 11/19/2012 1117   ALT 9 11/19/2012 1117   BILITOT 0.98 11/19/2012 1117     Results for [name], [name] (MRN [id number]) as of 11/19/2012 15:10  Ref. Range 09/11/2012 10:46 10/07/2012 08:54 10/29/2012 13:27  PSA Latest Range: <=4.00 ng/mL 50.68 (H) 44.32 (H) 28.00 (H)    Impression and Plan: This is a 78 year old gentleman with the following issues:  1. Castration resistant prostate cancer. He is currently on Taxotere and is overall tolerating this well. Recommend that he proceed with cycle 4of his chemotherapy today without dose modification. PSA has dropped from 50.58 to 28.00. PSA is pending today.  2. Bony disease. He remains on Xgeva monthly at [facility].  3. Androgen deprivation. He remains on Lupron at [facility].  4. Anemia. Due to chemotherapy. No active bleeding. No transfusion is indicated.  5. Follow-up. In 3 weeks for cycle 5 of chemotherapy.      Ameisha Mcclellan 12/10/20133:10 PM

## 2012-11-19 NOTE — Patient Instructions (Addendum)
[facility] Discharge Instructions for Patients Receiving Chemotherapy  Today you received the following chemotherapy agents Taxotere.  To help prevent nausea and vomiting after your treatment, we encourage you to take your nausea medication as ordered per MD.  If you develop nausea and vomiting that is not controlled by your nausea medication, call the clinic. If it is after clinic hours your family physician or the after hours number for the clinic or go to the Emergency Department.   BELOW ARE SYMPTOMS THAT SHOULD BE REPORTED IMMEDIATELY:  *FEVER GREATER THAN 100.5 F  *CHILLS WITH OR WITHOUT FEVER  NAUSEA AND VOMITING THAT IS NOT CONTROLLED WITH YOUR NAUSEA MEDICATION  *UNUSUAL SHORTNESS OF BREATH  *UNUSUAL BRUISING OR BLEEDING  TENDERNESS IN MOUTH AND THROAT WITH OR WITHOUT PRESENCE OF ULCERS  *URINARY PROBLEMS  *BOWEL PROBLEMS  UNUSUAL RASH Items with * indicate a potential emergency and should be followed up as soon as possible.   Please let the nurse know about any problems that you may have experienced. Feel free to call the clinic you have any questions or concerns. The clinic phone number is [phone].   I have been informed and understand all the instructions given to me. I know to contact the clinic, my physician, or go to the Emergency Department if any problems should occur. I do not have any questions at this time, but understand that I may call the clinic during office hours   should I have any questions or need assistance in obtaining follow up care.    __________________________________________  _____________  __________ Signature of Patient or Authorized Representative            Date                   Time    __________________________________________ Nurse's Signature

## 2012-11-19 NOTE — Patient Instructions (Addendum)
Results for Schaefer, Aaron SHAWN (MRN [id number]) as of 11/19/2012 11:49  Ref. Range 09/11/2012 10:46 10/07/2012 08:54 10/29/2012 13:27  PSA Latest Range: <=4.00 ng/mL 50.68 (H) 44.32 (H) 28.00 (H)

## 2012-11-20 ENCOUNTER — Ambulatory Visit (HOSPITAL_BASED_OUTPATIENT_CLINIC_OR_DEPARTMENT_OTHER): Payer: Medicare Other

## 2012-11-20 VITALS — BP 122/74 | HR 93 | Temp 97.3°F

## 2012-11-20 DIAGNOSIS — C61 Malignant neoplasm of prostate: Secondary | ICD-10-CM

## 2012-11-20 DIAGNOSIS — C7951 Secondary malignant neoplasm of bone: Secondary | ICD-10-CM

## 2012-11-20 MED ORDER — PEGFILGRASTIM INJECTION 6 MG/0.6ML
6.0000 mg | Freq: Once | SUBCUTANEOUS | Status: AC
  Administered 2012-11-20: 6 mg via SUBCUTANEOUS
  Filled 2012-11-20: qty 0.6

## 2012-11-23 ENCOUNTER — Encounter: Payer: Self-pay | Admitting: Oncology

## 2012-11-23 NOTE — Progress Notes (Signed)
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 No recent antibiotic therapy. No vomiting.  Patient was advised to keep up with by mouth fluids, drink Gator aid, I am prescribing Lomotil 2.5 mg 2 tablets now then one tablet every 2 hours maximum 8 tablets per 24 hours Flagyl 250 mg by mouth 3 times a day x10 days. He is advised to call if the diarrhea does not stop.

## 2012-12-09 ENCOUNTER — Telehealth: Payer: Self-pay | Admitting: *Deleted

## 2012-12-09 NOTE — Telephone Encounter (Signed)
Wife calling to say patient's legs and ankles are swollen. Denies fever, red streaks, hot to touch and is able to bare weight, without pain. Patient has appt tomorrow with dr Clelia Croft. He will evaluate then.

## 2012-12-10 ENCOUNTER — Other Ambulatory Visit (HOSPITAL_BASED_OUTPATIENT_CLINIC_OR_DEPARTMENT_OTHER): Payer: Medicare Other | Admitting: Lab

## 2012-12-10 ENCOUNTER — Telehealth: Payer: Self-pay | Admitting: *Deleted

## 2012-12-10 ENCOUNTER — Telehealth: Payer: Self-pay | Admitting: Oncology

## 2012-12-10 ENCOUNTER — Ambulatory Visit (HOSPITAL_BASED_OUTPATIENT_CLINIC_OR_DEPARTMENT_OTHER): Payer: Medicare Other

## 2012-12-10 ENCOUNTER — Ambulatory Visit (HOSPITAL_BASED_OUTPATIENT_CLINIC_OR_DEPARTMENT_OTHER): Payer: Medicare Other | Admitting: Oncology

## 2012-12-10 VITALS — BP 130/76 | HR 103 | Temp 97.0°F | Resp 18 | Ht 65.5 in | Wt 194.2 lb

## 2012-12-10 DIAGNOSIS — C61 Malignant neoplasm of prostate: Secondary | ICD-10-CM

## 2012-12-10 DIAGNOSIS — D6481 Anemia due to antineoplastic chemotherapy: Secondary | ICD-10-CM

## 2012-12-10 DIAGNOSIS — C7952 Secondary malignant neoplasm of bone marrow: Secondary | ICD-10-CM

## 2012-12-10 DIAGNOSIS — C7951 Secondary malignant neoplasm of bone: Secondary | ICD-10-CM

## 2012-12-10 DIAGNOSIS — E291 Testicular hypofunction: Secondary | ICD-10-CM

## 2012-12-10 DIAGNOSIS — Z5111 Encounter for antineoplastic chemotherapy: Secondary | ICD-10-CM

## 2012-12-10 LAB — CBC WITH DIFFERENTIAL/PLATELET
BASO%: 2.2 % — ABNORMAL HIGH (ref 0.0–2.0)
HCT: 32.5 % — ABNORMAL LOW (ref 38.4–49.9)
LYMPH%: 20 % (ref 14.0–49.0)
MCHC: 30.5 g/dL — ABNORMAL LOW (ref 32.0–36.0)
MCV: 94.5 fL (ref 79.3–98.0)
MONO%: 14 % (ref 0.0–14.0)
NEUT%: 63.8 % (ref 39.0–75.0)
Platelets: 197 10*3/uL (ref 140–400)
RBC: 3.44 10*6/uL — ABNORMAL LOW (ref 4.20–5.82)
nRBC: 1 % — ABNORMAL HIGH (ref 0–0)

## 2012-12-10 LAB — COMPREHENSIVE METABOLIC PANEL (CC13)
ALT: 8 U/L (ref 0–55)
AST: 16 U/L (ref 5–34)
BUN: 11 mg/dL (ref 7.0–26.0)
CO2: 26 mEq/L (ref 22–29)
Calcium: 8.5 mg/dL (ref 8.4–10.4)
Chloride: 111 mEq/L — ABNORMAL HIGH (ref 98–107)
Creatinine: 0.9 mg/dL (ref 0.7–1.3)
Total Bilirubin: 1.07 mg/dL (ref 0.20–1.20)

## 2012-12-10 LAB — PSA: PSA: 18.16 ng/mL — ABNORMAL HIGH (ref <=4.00)

## 2012-12-10 MED ORDER — DEXAMETHASONE SODIUM PHOSPHATE 10 MG/ML IJ SOLN
10.0000 mg | Freq: Once | INTRAMUSCULAR | Status: AC
  Administered 2012-12-10: 10 mg via INTRAVENOUS

## 2012-12-10 MED ORDER — SODIUM CHLORIDE 0.9 % IV SOLN
Freq: Once | INTRAVENOUS | Status: AC
  Administered 2012-12-10: 11:00:00 via INTRAVENOUS

## 2012-12-10 MED ORDER — ONDANSETRON 8 MG/50ML IVPB (CHCC)
8.0000 mg | Freq: Once | INTRAVENOUS | Status: AC
  Administered 2012-12-10: 8 mg via INTRAVENOUS

## 2012-12-10 MED ORDER — DOCETAXEL CHEMO INJECTION 160 MG/16ML
75.0000 mg/m2 | Freq: Once | INTRAVENOUS | Status: AC
  Administered 2012-12-10: 150 mg via INTRAVENOUS
  Filled 2012-12-10: qty 15

## 2012-12-10 NOTE — Telephone Encounter (Signed)
Per staff phone call and POF I have scheduled appts. JMW  

## 2012-12-10 NOTE — Progress Notes (Signed)
Hematology and Oncology Follow Up Visit  Aaron Schaefer [id number] October 14, 1933 78 y.o. 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 Initial diagnosis was in 2001. Gleason score is 3+4 = 7. PSA was 4.96.  Prior Therapy:  1. S/P prostateectomy followed by radiation therapy at the time of diagnosis. 2. Started on degarelix on 11/21/10 due to Schaefer rising PSA. This was switched to Lupron in March 2012 due to pain at the injection site. 3. He developed bone mets at T3, T4, & T5 and was started on Casodex and Xgeva on 12/22/11. 4. Received Provenge 02/15/12 through 03/14/12. 5. He received Xtandi from June 2013 to September 2013.  Current therapy: Systemic chemotherapy with Taxotere started on 09/16/12. He is here for cycle 5 today. He remains on Lupron and Xgeva at [facility].  Interim History:  Aaron Schaefer is see today for routine follow-up with his wife. He received his third cycle of Taxotere 4 weeks ago. He has mild fatigue, but performance status is unchanged. He had intermittent diarrhea following the chemotherapy. He had to use Imodium 2-3 times, but the diarrhea has now resolved. He had intermittent nausea and vomited one time. Anti-emetics were overall effective for him. No other rashes. No pain. Denies chest pain, shortness of breath, abdominal pain. No urinary complaints. No hematuria. He reports that his taste is affected and have lost few pounds. No neuropathy. He reports swelling in both ankles at this time. No pain or erythema. This has improved with elevation of his legs.   Medications: I have reviewed the patient's current medications. Current outpatient prescriptions:calcium carbonate (OS-CAL) 600 MG TABS, Take 600 mg by mouth daily., Disp: , Rfl: ;  diphenhydrAMINE (BENADRYL) 25 MG tablet, Take 25 mg by mouth every 6 (six) hours as needed. As needed for itching, Disp: , Rfl: ;   loperamide (IMODIUM) 2 MG capsule, Take 2 mg by mouth as needed., Disp: , Rfl:  ondansetron (ZOFRAN) 8 MG tablet, Take 1 tablet (8 mg total) by mouth every 8 (eight) hours as needed for nausea., Disp: 20 tablet, Rfl: 0;  simvastatin (ZOCOR) 80 MG tablet, Take 80 mg by mouth at bedtime., Disp: , Rfl:   Allergies: No Known Allergies  Past Medical History, Surgical history, Social history, and Family History were reviewed and updated.  Review of Systems: Constitutional:  Negative for fever, chills, night sweats, anorexia, weight loss, pain. Cardiovascular: no chest pain or dyspnea on exertion Respiratory: no cough, shortness of breath, or wheezing Neurological: no TIA or stroke symptoms Dermatological: negative ENT: negative Skin: Negative. Gastrointestinal: no abdominal pain, change in bowel habits, or black or bloody stools Genito-Urinary: no dysuria, trouble voiding, or hematuria Hematological and Lymphatic: negative Breast: negative for breast lumps Musculoskeletal: negative Remaining ROS negative.  Physical Exam: Blood pressure 130/76, pulse 103, temperature 97 F (36.1 C), temperature source Oral, resp. rate 18, height 5' 5.5" (1.664 m), weight 194 lb 3.2 oz (88.089 kg). ECOG: 0-1 General appearance: alert, cooperative and no distress Head: Normocephalic, without obvious abnormality, atraumatic Neck: no adenopathy, no carotid bruit, no JVD, supple, symmetrical, trachea midline and thyroid not enlarged, symmetric, no tenderness/mass/nodules Lymph nodes: Cervical, supraclavicular, and axillary nodes normal. Heart:regular rate and rhythm, S1, S2 normal, no murmur, click, rub or gallop Lung:chest clear, no wheezing, rales, normal symmetric air entry, no tachypnea, retractions or cyanosis Abdomen: soft, non-tender, without masses or organomegaly EXT:no erythema, induration, or nodules. 2+ edema at the  ankle.    Lab Results: Lab Results  Component Value Date   WBC 4.5 12/10/2012     HGB 9.9* 12/10/2012   HCT 32.5* 12/10/2012   MCV 94.5 12/10/2012   PLT 197 12/10/2012     Chemistry      Component Value Date/Time   NA 143 11/19/2012 1117   K 4.4 11/19/2012 1117   CL 109* 11/19/2012 1117   CO2 28 11/19/2012 1117   BUN 9.0 11/19/2012 1117   CREATININE 0.9 11/19/2012 1117   CREATININE 1.05 01/01/2012 0923      Component Value Date/Time   CALCIUM 8.8 11/19/2012 1117   ALKPHOS 119 11/19/2012 1117   AST 18 11/19/2012 1117   ALT 9 11/19/2012 1117   BILITOT 0.98 11/19/2012 1117      Results for Aaron Schaefer, Aaron Schaefer (MRN [id number]) as of 12/10/2012 09:58  Ref. Range 09/11/2012 10:46 10/07/2012 08:54 10/29/2012 13:27 11/19/2012 11:17  PSA Latest Range: <=4.00 ng/mL 50.68 (H) 44.32 (H) 28.00 (H) 23.32 (H)    Impression and Plan: This is Schaefer 78 year old gentleman with the following issues:  1. Castration resistant prostate cancer. He is currently on Taxotere and is overall tolerating this well. Recommend that he proceed with cycle 5 of his chemotherapy today without dose modification. PSA has dropped from 50.58 to 23.32. PSA is pending today.  2. Bony disease. He remains on Xgeva monthly at [facility].  3. Androgen deprivation. He remains on Lupron at [facility].  4. Anemia. Due to chemotherapy. No active bleeding. No transfusion is indicated.  5. Follow-up. In 3 weeks for cycle 6 of chemotherapy.   6. LE swelling: improved with elevation and I asked him to use compression stocking as well.     Kaybree Williams 12/31/201310:19 AM

## 2012-12-10 NOTE — Telephone Encounter (Signed)
appts made and printed for pt  °

## 2012-12-10 NOTE — Patient Instructions (Signed)
[facility] Discharge Instructions for Patients Receiving Chemotherapy  Today you received the following chemotherapy agents  Taxotere To help prevent nausea and vomiting after your treatment, we encourage you to take your nausea medication   Take it as often as prescribed.   If you develop nausea and vomiting that is not controlled by your nausea medication, call the clinic. If it is after clinic hours your family physician or the after hours number for the clinic or go to the Emergency Department.   BELOW ARE SYMPTOMS THAT SHOULD BE REPORTED IMMEDIATELY:  *FEVER GREATER THAN 100.5 F  *CHILLS WITH OR WITHOUT FEVER  NAUSEA AND VOMITING THAT IS NOT CONTROLLED WITH YOUR NAUSEA MEDICATION  *UNUSUAL SHORTNESS OF BREATH  *UNUSUAL BRUISING OR BLEEDING  TENDERNESS IN MOUTH AND THROAT WITH OR WITHOUT PRESENCE OF ULCERS  *URINARY PROBLEMS  *BOWEL PROBLEMS  UNUSUAL RASH Items with * indicate a potential emergency and should be followed up as soon as possible.  If this is your first treatment one of the nurses will contact you 24 hours after your treatment. Please let the nurse know about any problems that you may have experienced. Feel free to call the clinic you have any questions or concerns. The clinic phone number is [phone].   I have been informed and understand all the instructions given to me. I know to contact the clinic, my physician, or go to the Emergency Department if any problems should occur. I do not have any questions at this time, but understand that I may call the clinic during office hours   should I have any questions or need assistance in obtaining follow up care.    __________________________________________  _____________  __________ Signature of Patient or Authorized Representative            Date                   Time    __________________________________________ Nurse's Signature

## 2012-12-12 ENCOUNTER — Ambulatory Visit (HOSPITAL_BASED_OUTPATIENT_CLINIC_OR_DEPARTMENT_OTHER): Payer: Medicare Other

## 2012-12-12 ENCOUNTER — Telehealth: Payer: Self-pay

## 2012-12-12 VITALS — BP 111/67 | HR 105 | Temp 97.7°F

## 2012-12-12 DIAGNOSIS — C61 Malignant neoplasm of prostate: Secondary | ICD-10-CM

## 2012-12-12 MED ORDER — PEGFILGRASTIM INJECTION 6 MG/0.6ML
6.0000 mg | Freq: Once | SUBCUTANEOUS | Status: AC
  Administered 2012-12-12: 6 mg via SUBCUTANEOUS
  Filled 2012-12-12: qty 0.6

## 2012-12-12 NOTE — Telephone Encounter (Signed)
Message copied by Kallie Locks on Thu Dec 12, 2012 10:23 AM ------      Message from: Benjiman Core      Created: Thu Dec 12, 2012  9:23 AM       Please call his PSA

## 2012-12-15 ENCOUNTER — Emergency Department (HOSPITAL_COMMUNITY): Payer: Medicare Other

## 2012-12-15 ENCOUNTER — Emergency Department (HOSPITAL_COMMUNITY)
Admission: EM | Admit: 2012-12-15 | Discharge: 2012-12-15 | Disposition: A | Discharge status: Home / Self Care | Payer: Medicare Other | Attending: Emergency Medicine | Admitting: Emergency Medicine

## 2012-12-15 ENCOUNTER — Encounter (HOSPITAL_COMMUNITY): Payer: Self-pay | Admitting: *Deleted

## 2012-12-15 DIAGNOSIS — R531 Weakness: Secondary | ICD-10-CM

## 2012-12-15 DIAGNOSIS — Z8546 Personal history of malignant neoplasm of prostate: Secondary | ICD-10-CM | POA: Insufficient documentation

## 2012-12-15 DIAGNOSIS — R05 Cough: Secondary | ICD-10-CM | POA: Insufficient documentation

## 2012-12-15 DIAGNOSIS — J189 Pneumonia, unspecified organism: Secondary | ICD-10-CM

## 2012-12-15 DIAGNOSIS — R110 Nausea: Secondary | ICD-10-CM | POA: Insufficient documentation

## 2012-12-15 DIAGNOSIS — C7951 Secondary malignant neoplasm of bone: Secondary | ICD-10-CM | POA: Insufficient documentation

## 2012-12-15 DIAGNOSIS — R197 Diarrhea, unspecified: Secondary | ICD-10-CM | POA: Insufficient documentation

## 2012-12-15 DIAGNOSIS — Z9221 Personal history of antineoplastic chemotherapy: Secondary | ICD-10-CM | POA: Insufficient documentation

## 2012-12-15 DIAGNOSIS — C7952 Secondary malignant neoplasm of bone marrow: Secondary | ICD-10-CM | POA: Insufficient documentation

## 2012-12-15 DIAGNOSIS — F17200 Nicotine dependence, unspecified, uncomplicated: Secondary | ICD-10-CM | POA: Insufficient documentation

## 2012-12-15 DIAGNOSIS — R42 Dizziness and giddiness: Secondary | ICD-10-CM | POA: Insufficient documentation

## 2012-12-15 DIAGNOSIS — E785 Hyperlipidemia, unspecified: Secondary | ICD-10-CM | POA: Insufficient documentation

## 2012-12-15 DIAGNOSIS — R059 Cough, unspecified: Secondary | ICD-10-CM | POA: Insufficient documentation

## 2012-12-15 DIAGNOSIS — R5381 Other malaise: Secondary | ICD-10-CM | POA: Insufficient documentation

## 2012-12-15 LAB — COMPREHENSIVE METABOLIC PANEL WITH GFR
Albumin: 3.2 g/dL — ABNORMAL LOW (ref 3.5–5.2)
Alkaline Phosphatase: 102 U/L (ref 39–117)
BUN: 17 mg/dL (ref 6–23)
Calcium: 8.3 mg/dL — ABNORMAL LOW (ref 8.4–10.5)
Creatinine, Ser: 0.87 mg/dL (ref 0.50–1.35)
GFR calc Af Amer: >90 mL/min (ref >90)
Glucose, Bld: 100 mg/dL — ABNORMAL HIGH (ref 70–99)
Potassium: 3.9 meq/L (ref 3.5–5.1)
Total Protein: 5.9 g/dL — ABNORMAL LOW (ref 6.0–8.3)

## 2012-12-15 LAB — CBC WITH DIFFERENTIAL/PLATELET
Basophils Absolute: 0.4 K/uL — ABNORMAL HIGH (ref 0.0–0.1)
Basophils Relative: 6 % — ABNORMAL HIGH (ref 0–1)
Eosinophils Absolute: 0 K/uL (ref 0.0–0.7)
Eosinophils Relative: 0 % (ref 0–5)
HCT: 32.6 % — ABNORMAL LOW (ref 39.0–52.0)
Hemoglobin: 9.9 g/dL — ABNORMAL LOW (ref 13.0–17.0)
Lymphocytes Relative: 10 % — ABNORMAL LOW (ref 12–46)
Lymphs Abs: 0.6 K/uL — ABNORMAL LOW (ref 0.7–4.0)
MCH: 28.8 pg (ref 26.0–34.0)
MCHC: 30.4 g/dL (ref 30.0–36.0)
MCV: 94.8 fL (ref 78.0–100.0)
Monocytes Absolute: 0.2 K/uL (ref 0.1–1.0)
Monocytes Relative: 4 % (ref 3–12)
Neutro Abs: 4.7 K/uL (ref 1.7–7.7)
Neutrophils Relative %: 80 % — ABNORMAL HIGH (ref 43–77)
Platelets: 125 K/uL — ABNORMAL LOW (ref 150–400)
RBC: 3.44 MIL/uL — ABNORMAL LOW (ref 4.22–5.81)
RDW: 15 % (ref 11.5–15.5)
WBC: 5.9 K/uL (ref 4.0–10.5)

## 2012-12-15 LAB — COMPREHENSIVE METABOLIC PANEL
ALT: 9 U/L (ref 0–53)
AST: 18 U/L (ref 0–37)
CO2: 26 mEq/L (ref 19–32)
Chloride: 104 mEq/L (ref 96–112)
GFR calc non Af Amer: 81 mL/min — ABNORMAL LOW (ref >90)
Sodium: 140 mEq/L (ref 135–145)
Total Bilirubin: 1.2 mg/dL (ref 0.3–1.2)

## 2012-12-15 LAB — PROTIME-INR
INR: 1.11 (ref 0.00–1.49)
Prothrombin Time: 14.2 s (ref 11.6–15.2)

## 2012-12-15 LAB — TROPONIN I: Troponin I: <0.3 ng/mL (ref <0.30)

## 2012-12-15 LAB — APTT: aPTT: 33 s (ref 24–37)

## 2012-12-15 MED ORDER — LEVOFLOXACIN 500 MG PO TABS: 500.0000 mg | ORAL_TABLET | Freq: Every day | ORAL | Status: DC

## 2012-12-15 MED ORDER — SODIUM CHLORIDE 0.9 % IV BOLUS (SEPSIS)
500.0000 mL | Freq: Once | INTRAVENOUS | Status: AC
  Administered 2012-12-15: 500 mL via INTRAVENOUS

## 2012-12-15 MED ORDER — SODIUM CHLORIDE 0.9 % IV BOLUS (SEPSIS)
1000.0000 mL | Freq: Once | INTRAVENOUS | Status: AC
  Administered 2012-12-15: 1000 mL via INTRAVENOUS

## 2012-12-15 MED ORDER — IOHEXOL 350 MG/ML SOLN
100.0000 mL | Freq: Once | INTRAVENOUS | Status: AC | PRN
  Administered 2012-12-15: 100 mL via INTRAVENOUS

## 2012-12-15 NOTE — ED Notes (Signed)
RN to obtain labs with start of IV 

## 2012-12-15 NOTE — ED Provider Notes (Signed)
History     CSN: [id number]  Arrival date & time 12/15/12  1309   First MD Initiated Contact with Patient 12/15/12 1316      Chief Complaint  Patient presents with  . Weakness  . cancer pt     (Consider location/radiation/quality/duration/timing/severity/associated sxs/prior treatment) HPI Comments: Patient with a history of metastatic prostate cancer to the spine has been undergoing chemotherapy every 3 weeks. His last session was this Tuesday which was 5 days ago. He reports he usually develops fatigue, generalized weakness and diarrhea. Patient's family reports that after each session which he has had 5 sessions thus far, his symptoms seemed to be progressively worse than the previous. Patient reports on Wednesday, he had his same generalized fatigue associated with 5 or 6 episodes of diarrhea that was nonbloody. He reports that his appetite has been down. He has had a small amount of chest congestion and dry coughing according to family. Patient denies any hematemesis, only occasional nausea, no fever or chills. 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 He has felt dizzy and lightheaded at times but no syncope. He denies any chest pain, abdominal pain, back pain, flank pain. Patient has a significant history of hyperlipidemia, seasonal allergies and a metastatic cancer. Patient does continue to smoke cigarettes, no alcohol or drug use. Patient also has been battling some lower extremity edema. The patient reports that he has had some chronic swelling of his right leg, however recently both legs have been extremely swollen, improving with rest and leg elevation.  The history is provided by the patient and a relative.    Past Medical History  Diagnosis Date  . Prostate cancer   . Allergy   . Hyperlipidemia     Past Surgical History  Procedure Date  . Cholecystectomy   . Cataract  extraction     Family History  Problem Relation Age of Onset  . Prostate cancer Neg Hx     History  Substance Use Topics  . Smoking status: Current Every Day Smoker -- 0.5 packs/day    Types: Cigarettes  . Smokeless tobacco: Not on file  . Alcohol Use:       Review of Systems  Constitutional: Positive for fatigue. Negative for fever and chills.  HENT: Negative for congestion, rhinorrhea and neck pain.   Respiratory: Positive for cough. Negative for chest tightness, shortness of breath and wheezing.   Cardiovascular: Negative for chest pain.  Gastrointestinal: Positive for nausea and diarrhea. Negative for vomiting, blood in stool and anal bleeding.  Musculoskeletal: Negative for back pain and arthralgias.  Neurological: Positive for weakness and light-headedness. Negative for syncope and headaches.  All other systems reviewed and are negative.    Allergies  Review of patient's allergies indicates no known allergies.  Home Medications   Current Outpatient Rx  Name  Route  Sig  Dispense  Refill  . CALCIUM CARBONATE 600 MG PO TABS   Oral   Take 600 mg by mouth daily.         Marland Kitchen LOPERAMIDE HCL 2 MG PO CAPS   Oral   Take 2 mg by mouth as needed. Diarrhea         . ONDANSETRON HCL 8 MG PO TABS   Oral   Take 8 mg by mouth every 8 (eight) hours as needed. Nausea         . SIMVASTATIN 80  MG PO TABS   Oral   Take 80 mg by mouth at bedtime.           BP 119/62  Pulse 118  Temp 98 F (36.7 C) (Oral)  Resp 18  SpO2 90%  Physical Exam  Nursing note and vitals reviewed. Constitutional: He is oriented to person, place, and time. He appears well-developed and well-nourished.  HENT:  Head: Normocephalic and atraumatic.  Eyes: EOM are normal. Pupils are equal, round, and reactive to light.  Neck: Normal range of motion. Neck supple. No JVD present.  Cardiovascular: Normal rate.   No murmur heard. Pulmonary/Chest: Effort normal. No respiratory distress. He  has no wheezes. He has no rales.  Abdominal: Soft. There is no rebound and no guarding.  Musculoskeletal: He exhibits edema. He exhibits no tenderness.  Neurological: He is alert and oriented to person, place, and time. No cranial nerve deficit.  Skin: Skin is warm and dry. No rash noted. No pallor.  Psychiatric: He has a normal mood and affect.    ED Course  Procedures (including critical care time)  Labs Reviewed  CBC WITH DIFFERENTIAL - Abnormal; Notable for the following:    RBC 3.44 (*)     Hemoglobin 9.9 (*)     HCT 32.6 (*)     Platelets 125 (*)     Neutrophils Relative 80 (*)     Lymphocytes Relative 10 (*)     Basophils Relative 6 (*)     Lymphs Abs 0.6 (*)     Basophils Absolute 0.4 (*)     All other components within normal limits  COMPREHENSIVE METABOLIC PANEL - Abnormal; Notable for the following:    Glucose, Bld 100 (*)     Calcium 8.3 (*)     Total Protein 5.9 (*)     Albumin 3.2 (*)     GFR calc non Af Amer 81 (*)     All other components within normal limits  PROTIME-INR  APTT  TROPONIN I   Ct Angio Chest W/cm &/or Wo Cm  12/15/2012  *RADIOLOGY REPORT*  Clinical Data: Hypoxia.  Weakness.  Prostate cancer.  CT ANGIOGRAPHY CHEST  Technique:  Multidetector CT imaging of the chest using the standard protocol during bolus administration of intravenous contrast. Multiplanar reconstructed images including MIPs were obtained and reviewed to evaluate the vascular anatomy.  Contrast: OMNIPAQUE IOHEXOL 350 MG/ML SOLN  Comparison: 12/15/2012  Findings: No filling defect is identified in the pulmonary arterial tree to suggest pulmonary embolus.  No acute aortic findings.  Lower paraesophageal node short axis 0.9 cm on image 57 of series 4.  No overt pathologic thoracic adenopathy.  Volume loss and considerable airway thickening and airway plugging noted in both lower lobes and in segmental and subsegmental branches.  Patchy ground-glass opacities noted in the lower lobes,  right greater than left, and to a lesser extent in the lingula.  Scattered osseous metastatic disease noted in the sternum, ribs, scapulae, clavicles, bilateral proximal humerus, and throughout much of the thoracic spine most strikingly T3 through T8.  No definite vertebral destructive findings.  Cardiomegaly noted without abnormal contour reversal of the interventricular septum.  IMPRESSION:  1.  No embolus identified. 2.  Diffuse osseous metastatic disease in the thorax. 3.  Bilateral airway thickening, most severe in the lower lobes, with associated airway plugging and patchy ground-glass opacities likely due to postobstructive pneumonitis or early pneumonia. 4.  Cardiomegaly.   Original Report Authenticated By: Gaylyn Rong, M.D.  Dg Chest Port 1 View  12/15/2012  *RADIOLOGY REPORT*  Clinical Data: Chest pain  PORTABLE CHEST - 1 VIEW  Comparison: 12/22/2011, 01/28/2006, thoracic spine MRI 01/01/2012  Findings: There are new sclerotic lesions involving the proximal humeri bilaterally.  Heart size is mildly enlarged.  The lungs are clear.  No pleural effusion.  IMPRESSION: New sclerotic humeral lesions, likely indicating that of osseous metastatic disease as seen on prior exams.  No acute cardiopulmonary process.   Original Report Authenticated By: Christiana Pellant, M.D.      1. Weakness   2. Healthcare-associated pneumonia     ra sat has ranged from 88-95% while I watch with good wave form, which I interpret to be abnormal  3:09 PM Labs are otherwise unremarkable.  Does not suggest significant dehydration.  Troponin is negative.  CXR shows no infiltrates, edema.   5:22 PM Clinically the patient does feel improved. Based on the CT scan showing possible early pneumonia, plan is to start on antibiotics. Patient remains mildly hypoxemic however the patient is not symptomatic. He ambulated on his own and ambulate for long distance and reports not feeling dizzy, short of breath and lightheaded. His  oxygen saturation stayed between 88% and 91%. Given he is a long-time smoker this may be his normal oxygenation. Patient feels comfortable being discharged as does his family. He is encouraged to return for any worsening symptoms. A plan is to put him on Levaquin for a possible early pneumonia seen on CAT scan and he knows to followup with his oncologist and/or primary care physician early next week.  MDM  Pt with symptoms and signs of acute dehydration, likely due to chemo causing diarrhea and anorexia.  Will give IVF bolus, check labs, will consider chest CT scan for suspicion for possible PE.          Gavin Pound. Donnelle Olmeda, MD 12/15/12 1723

## 2012-12-15 NOTE — ED Notes (Signed)
Pt received chemo 12/31 for prostate cancer. Pitting/swelling edema to legs after chemo dose. Pt denies pain. Pt has had generalized weakness, anorexia, and swelling to feet. 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 Dr Nena Alexander cancer pt. Dr Earl Gala pcp.

## 2012-12-15 NOTE — ED Notes (Signed)
Pt ambulated from room to restroom. O2 sats at beginning of ambulation on room air 90% during ambulation O2 sats dropped to 88% on room air. Pt denies shortness of breath.

## 2012-12-15 NOTE — ED Notes (Signed)
Pt escorted to discharge window. Pt verbalized understanding discharge instructions. In no acute distress.  

## 2012-12-15 NOTE — ED Notes (Signed)
md at bedside

## 2012-12-15 NOTE — ED Notes (Signed)
Pt alert and oriented x4. Respirations even and unlabored, bilateral symmetrical rise and fall of chest. Skin warm and dry. In no acute distress. Denies needs.   

## 2012-12-15 NOTE — Discharge Instructions (Signed)

## 2012-12-15 NOTE — ED Notes (Signed)
Bed:WA09<BR> Expected date:<BR> Expected time:<BR> Means of arrival:<BR> Comments:<BR>

## 2012-12-16 ENCOUNTER — Telehealth: Payer: Self-pay | Admitting: *Deleted

## 2012-12-16 NOTE — Telephone Encounter (Signed)
Message left from wife on voice mail that patient was seen yesterday in ER for pneumonia and was instructed to call us to be seen sooner by Dr Caroline More next week according to ER MD dictation) Patient currently scheduled for 12/31/12 for lab/Kristin/chemo. Patient was started on Levaquin.  Called back, no answer, left message that Dr Clelia Croft was out today, but this nurse would leave a message for review upon his return and will schedule a follow up appt sooner if needed.  Left message we would call back in the next couple days with plan.

## 2012-12-17 ENCOUNTER — Telehealth: Payer: Self-pay

## 2012-12-17 NOTE — Telephone Encounter (Signed)
Called Aaron Schaefer to check on him, per MD request.  Spoke with Aaron Schaefer's wife, who states Aaron Schaefer is doing "so-so"  She states Aaron Schaefer is still not eating and drinking well, but is doing better with this that he was previously.  Aaron Schaefer states Aaron Schaefer did have diarrhea, but this has subsided since yesterday.  She states Aaron Schaefer is still taking antibiotic as prescribed by MD in the ED.  She states Aaron Schaefer does have some shortness of breath still, and slight swelling in bilateral ankles/feet.  She states Aaron Schaefer would like to be seen by MD sooner than 12/31/12.  Informed Dr. Clelia Croft of this, and he states to let Aaron Schaefer know that he will be contacted with a sooner appt.  Called back and left message on vm informing Aaron Schaefer of this.

## 2012-12-18 ENCOUNTER — Telehealth: Payer: Self-pay | Admitting: Oncology

## 2012-12-18 ENCOUNTER — Other Ambulatory Visit: Payer: Self-pay | Admitting: Oncology

## 2012-12-18 DIAGNOSIS — C61 Malignant neoplasm of prostate: Secondary | ICD-10-CM

## 2012-12-18 NOTE — Telephone Encounter (Signed)
s.w. pt and gv appt schedule for 1.9.14 .Marland KitchenMarland Kitchenpt ok

## 2012-12-19 ENCOUNTER — Other Ambulatory Visit (HOSPITAL_BASED_OUTPATIENT_CLINIC_OR_DEPARTMENT_OTHER): Payer: Medicare Other | Admitting: Lab

## 2012-12-19 ENCOUNTER — Encounter: Payer: Self-pay | Admitting: Oncology

## 2012-12-19 ENCOUNTER — Ambulatory Visit (HOSPITAL_BASED_OUTPATIENT_CLINIC_OR_DEPARTMENT_OTHER): Payer: Medicare Other | Admitting: Oncology

## 2012-12-19 VITALS — BP 127/78 | HR 109 | Temp 96.8°F | Resp 20 | Ht 65.5 in | Wt 183.2 lb

## 2012-12-19 DIAGNOSIS — C61 Malignant neoplasm of prostate: Secondary | ICD-10-CM

## 2012-12-19 DIAGNOSIS — D6481 Anemia due to antineoplastic chemotherapy: Secondary | ICD-10-CM

## 2012-12-19 DIAGNOSIS — T451X5A Adverse effect of antineoplastic and immunosuppressive drugs, initial encounter: Secondary | ICD-10-CM

## 2012-12-19 DIAGNOSIS — C7951 Secondary malignant neoplasm of bone: Secondary | ICD-10-CM

## 2012-12-19 DIAGNOSIS — C7952 Secondary malignant neoplasm of bone marrow: Secondary | ICD-10-CM

## 2012-12-19 DIAGNOSIS — R609 Edema, unspecified: Secondary | ICD-10-CM

## 2012-12-19 LAB — COMPREHENSIVE METABOLIC PANEL (CC13)
Albumin: 3.1 g/dL — ABNORMAL LOW (ref 3.5–5.0)
Alkaline Phosphatase: 145 U/L (ref 40–150)
BUN: 8 mg/dL (ref 7.0–26.0)
CO2: 25 mEq/L (ref 22–29)
Calcium: 7.9 mg/dL — ABNORMAL LOW (ref 8.4–10.4)
Glucose: 121 mg/dl — ABNORMAL HIGH (ref 70–99)
Potassium: 3.3 mEq/L — ABNORMAL LOW (ref 3.5–5.1)

## 2012-12-19 LAB — CBC WITH DIFFERENTIAL/PLATELET
Basophils Absolute: 0.1 10*3/uL (ref 0.0–0.1)
Eosinophils Absolute: 0 10*3/uL (ref 0.0–0.5)
HGB: 10.6 g/dL — ABNORMAL LOW (ref 13.0–17.1)
MCV: 93.3 fL (ref 79.3–98.0)
MONO%: 6.2 % (ref 0.0–14.0)
NEUT#: 23.9 10*3/uL — ABNORMAL HIGH (ref 1.5–6.5)
Platelets: 166 10*3/uL (ref 140–400)
RDW: 15.6 % — ABNORMAL HIGH (ref 11.0–14.6)

## 2012-12-19 MED ORDER — DRONABINOL 2.5 MG PO CAPS: 2.5000 mg | ORAL_CAPSULE | Freq: Two times a day (BID) | ORAL | Status: DC

## 2012-12-19 NOTE — Progress Notes (Signed)
Hematology and Oncology Follow Up Visit  Aaron Schaefer [id number] 04/29/1933 79 y.o. 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 Initial diagnosis was in 2001. Gleason score is 3+4 = 7. PSA was 4.96.  Prior Therapy:  1. S/P prostateectomy followed by radiation therapy at the time of diagnosis. 2. Started on degarelix on 11/21/10 due to Schaefer rising PSA. This was switched to Lupron in March 2012 due to pain at the injection site. 3. He developed bone mets at T3, T4, & T5 and was started on Casodex and Xgeva on 12/22/11. 4. Received Provenge 02/15/12 through 03/14/12. 5. He received Xtandi from June 2013 to September 2013.  Current therapy: Systemic chemotherapy with Taxotere started on 09/16/12. S/P 5 cycles. He remains on Lupron and Xgeva at [facility].  Interim History:  Aaron Schaefer is see today for Schaefer work-in with his wife and sister. Patient was seen in the ER on 12/15/12 due to fatigue and weakness. He was given IVF and found to have early pneumonia on Schaefer CT scan. He was placed on Levaquin. He remains weak and not eating well. Weight is down about 10 lbs in 10 days. He continues to have Schaefer productive cough. No fevers or chills noted. No SOB or dyspnea. He reports worsening edema to his bilateral lower extremities. This was worse right after treatment, but slightly improved. Legs are shiny and tight feeling. He also reports numbness and pain to his feet and fingertips. No urinary complaints. No hematuria.   Medications: I have reviewed the patient's current medications. Current outpatient prescriptions:calcium carbonate (OS-CAL) 600 MG TABS, Take 600 mg by mouth daily., Disp: , Rfl: ;  dronabinol (MARINOL) 2.5 MG capsule, Take 1 capsule (2.5 mg total) by mouth 2 (two) times daily before Schaefer meal., Disp: 60 capsule, Rfl: 0;  levofloxacin (LEVAQUIN) 500 MG tablet, Take 1 tablet (500 mg total) by mouth  daily., Disp: 10 tablet, Rfl: 0 loperamide (IMODIUM) 2 MG capsule, Take 2 mg by mouth as needed. Diarrhea, Disp: , Rfl: ;  ondansetron (ZOFRAN) 8 MG tablet, Take 8 mg by mouth every 8 (eight) hours as needed. Nausea, Disp: , Rfl: ;  simvastatin (ZOCOR) 80 MG tablet, Take 80 mg by mouth at bedtime., Disp: , Rfl:   Allergies: No Known Allergies  Past Medical History, Surgical history, Social history, and Family History were reviewed and updated.  Review of Systems: Constitutional:  Negative for fever, chills, night sweats, anorexia, weight loss, pain. Cardiovascular: no chest pain or dyspnea on exertion Respiratory: no cough, shortness of breath, or wheezing Neurological: no TIA or stroke symptoms Dermatological: negative ENT: negative Skin: Negative. Gastrointestinal: no abdominal pain, change in bowel habits, or black or bloody stools Genito-Urinary: no dysuria, trouble voiding, or hematuria Hematological and Lymphatic: negative Breast: negative for breast lumps Musculoskeletal: negative Remaining ROS negative.  Physical Exam: Blood pressure 127/78, pulse 109, temperature 96.8 F (36 C), temperature source Oral, resp. rate 20, height 5' 5.5" (1.664 m), weight 183 lb 3.2 oz (83.099 kg). ECOG: 0-1 General appearance: alert, cooperative and no distress Head: Normocephalic, without obvious abnormality, atraumatic Neck: no adenopathy, no carotid bruit, no JVD, supple, symmetrical, trachea midline and thyroid not enlarged, symmetric, no tenderness/mass/nodules Lymph nodes: Cervical, supraclavicular, and axillary nodes normal. Heart:regular rate and rhythm, S1, S2 normal, no murmur, click, rub or gallop Lung:chest clear, no wheezing, rales, normal symmetric air entry, no tachypnea, retractions or cyanosis Abdomen: soft, non-tender,  without masses or organomegaly EXT:no erythema, induration, or nodules. 2+ edema at the ankle.    Lab Results: Lab Results  Component Value Date   WBC  27.4* 12/19/2012   HGB 10.6* 12/19/2012   HCT 32.9* 12/19/2012   MCV 93.3 12/19/2012   PLT 166 12/19/2012     Chemistry      Component Value Date/Time   NA 143 12/19/2012 1112   NA 140 12/15/2012 1353   K 3.3* 12/19/2012 1112   K 3.9 12/15/2012 1353   CL 107 12/19/2012 1112   CL 104 12/15/2012 1353   CO2 25 12/19/2012 1112   CO2 26 12/15/2012 1353   BUN 8.0 12/19/2012 1112   BUN 17 12/15/2012 1353   CREATININE 0.8 12/19/2012 1112   CREATININE 0.87 12/15/2012 1353      Component Value Date/Time   CALCIUM 7.9* 12/19/2012 1112   CALCIUM 8.3* 12/15/2012 1353   ALKPHOS 145 12/19/2012 1112   ALKPHOS 102 12/15/2012 1353   AST 19 12/19/2012 1112   AST 18 12/15/2012 1353   ALT <6 Repeated and Verified 12/19/2012 1112   ALT 9 12/15/2012 1353   BILITOT 1.13 12/19/2012 1112   BILITOT 1.2 12/15/2012 1353      Results for Aaron Schaefer, Aaron Schaefer (MRN [id number]) as of 12/19/2012 15:42  Ref. Range 09/11/2012 10:46 10/07/2012 08:54 10/29/2012 13:27 11/19/2012 11:17 12/10/2012 09:42  PSA Latest Range: <=4.00 ng/mL 50.68 (H) 44.32 (H) 28.00 (H) 23.32 (H) 18.16 (H)   Impression and Plan: This is Schaefer 79 year old gentleman with the following issues:  1. Castration resistant prostate cancer. He is currently on Taxotere. Now developing worsening fatigue, neuropathy,  And edema. Plan to hold Taxotere at this time. Will discuss further treatment for prostate cancer once he improves.  2. Pneumonia. Continue Levaquin. He may use Mucinex twice Schaefer day.  3. Protein calorie malnutrition. I have recommended Ensure or Boost 2-3 times per day. I have prescribed Marinol BID.  4. Bony disease. He remains on Xgeva monthly at [facility].  5. Androgen deprivation. He remains on Lupron at [facility].  6. Anemia. Due to chemotherapy. No active bleeding. No transfusion is indicated.  7. LE edema. Due to Taxotere. I have encouraged him to elevate legs and use compression stockings. This should improve with stopping Taxotere. Will hold off on diuretic due to  risk of dehydration.   8. Follow-up. 1/21 as scheduled.     Aaron Schaefer 1/9/20143:42 PM

## 2012-12-19 NOTE — Patient Instructions (Addendum)
Appetite: Drink 2-3 cans of Ensure or Boost daily. I have written a prescription for Marinol 2.5 mg twice a day to stimulate your appetite.  Infection: Continue Levaquin daily until gone.  You may use over the counter Mucinex twice a day to help with the cough.  Swelling: Elevate legs as much as possible. You may purchase knee-hi compression stockings to help reduce the swelling.  Keep follow-up as scheduled later this month.

## 2012-12-24 ENCOUNTER — Telehealth: Payer: Self-pay | Admitting: *Deleted

## 2012-12-24 NOTE — Telephone Encounter (Signed)
Left message for patient to call me re: medication prior-authorization.

## 2012-12-31 ENCOUNTER — Encounter: Payer: Self-pay | Admitting: Oncology

## 2012-12-31 ENCOUNTER — Other Ambulatory Visit (HOSPITAL_BASED_OUTPATIENT_CLINIC_OR_DEPARTMENT_OTHER): Payer: Medicare Other | Admitting: Lab

## 2012-12-31 ENCOUNTER — Telehealth: Payer: Self-pay | Admitting: Oncology

## 2012-12-31 ENCOUNTER — Ambulatory Visit: Payer: Medicare Other

## 2012-12-31 ENCOUNTER — Ambulatory Visit (HOSPITAL_BASED_OUTPATIENT_CLINIC_OR_DEPARTMENT_OTHER): Payer: Medicare Other | Admitting: Oncology

## 2012-12-31 ENCOUNTER — Ambulatory Visit: Payer: Medicare Other | Admitting: Lab

## 2012-12-31 VITALS — BP 117/67 | HR 104 | Temp 96.7°F | Resp 20 | Ht 65.5 in | Wt 181.3 lb

## 2012-12-31 DIAGNOSIS — C61 Malignant neoplasm of prostate: Secondary | ICD-10-CM

## 2012-12-31 DIAGNOSIS — M949 Disorder of cartilage, unspecified: Secondary | ICD-10-CM

## 2012-12-31 DIAGNOSIS — R609 Edema, unspecified: Secondary | ICD-10-CM

## 2012-12-31 DIAGNOSIS — T451X5A Adverse effect of antineoplastic and immunosuppressive drugs, initial encounter: Secondary | ICD-10-CM

## 2012-12-31 DIAGNOSIS — G629 Polyneuropathy, unspecified: Secondary | ICD-10-CM

## 2012-12-31 LAB — COMPREHENSIVE METABOLIC PANEL (CC13)
ALT: 9 U/L (ref 0–55)
Albumin: 2.7 g/dL — ABNORMAL LOW (ref 3.5–5.0)
CO2: 27 mEq/L (ref 22–29)
Chloride: 107 mEq/L (ref 98–107)
Glucose: 107 mg/dl — ABNORMAL HIGH (ref 70–99)
Potassium: 4.6 mEq/L (ref 3.5–5.1)
Sodium: 141 mEq/L (ref 136–145)
Total Protein: 5.7 g/dL — ABNORMAL LOW (ref 6.4–8.3)

## 2012-12-31 LAB — CBC WITH DIFFERENTIAL/PLATELET
Basophils Absolute: 0.1 10*3/uL (ref 0.0–0.1)
Eosinophils Absolute: 0 10*3/uL (ref 0.0–0.5)
HGB: 9.8 g/dL — ABNORMAL LOW (ref 13.0–17.1)
MCV: 93.4 fL (ref 79.3–98.0)
MONO#: 0.8 10*3/uL (ref 0.1–0.9)
NEUT#: 1.8 10*3/uL (ref 1.5–6.5)
RDW: 15.4 % — ABNORMAL HIGH (ref 11.0–14.6)
WBC: 3.5 10*3/uL — ABNORMAL LOW (ref 4.0–10.3)
lymph#: 0.7 10*3/uL — ABNORMAL LOW (ref 0.9–3.3)
nRBC: 1 % — ABNORMAL HIGH (ref 0–0)

## 2012-12-31 LAB — PSA: PSA: 15.28 ng/mL — ABNORMAL HIGH (ref <=4.00)

## 2012-12-31 NOTE — Telephone Encounter (Signed)
appts made and cx per pof req,pt sent back to the lab

## 2012-12-31 NOTE — Progress Notes (Signed)
Hematology and Oncology Follow Up Visit  Aaron Schaefer [id number] 01-20-1933 79 y.o. 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 Initial diagnosis was in 2001. Gleason score is 3+4 = 7. PSA was 4.96.  Prior Therapy:  1. S/P prostateectomy followed by radiation therapy at the time of diagnosis. 2. Started on degarelix on 11/21/10 due to Schaefer rising PSA. This was switched to Lupron in March 2012 due to pain at the injection site. 3. He developed bone mets at T3, T4, & T5 and was started on Casodex and Xgeva on 12/22/11. 4. Received Provenge 02/15/12 through 03/14/12. 5. He received Xtandi from June 2013 to September 2013.  Current therapy: Systemic chemotherapy with Taxotere started on 09/16/12. S/P 5 cycles. He remains on Lupron and Xgeva at [facility].  Interim History:  Aaron Schaefer is seen today for routine follow-up. The patient reports his weakness is Schaefer little bit better. He still has lower extremity edema which he thinks is worse. He was prescribed Lasix by his PCP but has not yet started this. He has completed his course of Levaquin for pneumonia He continues to have Schaefer productive cough. No fevers or chills noted. No SOB or dyspnea. He reports worsening edema to his bilateral lower extremities. Legs are shiny and tight feeling. He also reports numbness and pain to his feet and fingertips. No urinary complaints. No hematuria. Appetite is improving and he has decided not to start Marinol.   Medications: I have reviewed the patient's current medications. Current outpatient prescriptions:furosemide (LASIX) 40 MG tablet, Take 40 mg by mouth daily., Disp: , Rfl: ;  calcium carbonate (OS-CAL) 600 MG TABS, Take 600 mg by mouth daily., Disp: , Rfl: ;  loperamide (IMODIUM) 2 MG capsule, Take 2 mg by mouth as needed. Diarrhea, Disp: , Rfl: ;  ondansetron (ZOFRAN) 8 MG tablet, Take 8 mg by mouth every 8  (eight) hours as needed. Nausea, Disp: , Rfl:  simvastatin (ZOCOR) 80 MG tablet, Take 80 mg by mouth at bedtime., Disp: , Rfl:   Allergies: No Known Allergies  Past Medical History, Surgical history, Social history, and Family History were reviewed and updated.  Review of Systems: Constitutional:  Negative for fever, chills, night sweats, anorexia, weight loss, pain. Cardiovascular: no chest pain or dyspnea on exertion Respiratory: no cough, shortness of breath, or wheezing Neurological: no TIA or stroke symptoms Dermatological: negative ENT: negative Skin: Negative. Gastrointestinal: no abdominal pain, change in bowel habits, or black or bloody stools Genito-Urinary: no dysuria, trouble voiding, or hematuria Hematological and Lymphatic: negative Breast: negative for breast lumps Musculoskeletal: negative Remaining ROS negative.  Physical Exam: Blood pressure 117/67, pulse 104, temperature 96.7 F (35.9 C), temperature source Oral, resp. rate 20, height 5' 5.5" (1.664 m), weight 181 lb 4.8 oz (82.237 kg). ECOG: 0-1 General appearance: alert, cooperative and no distress Head: Normocephalic, without obvious abnormality, atraumatic Neck: no adenopathy, no carotid bruit, no JVD, supple, symmetrical, trachea midline and thyroid not enlarged, symmetric, no tenderness/mass/nodules Lymph nodes: Cervical, supraclavicular, and axillary nodes normal. Heart:regular rate and rhythm, S1, S2 normal, no murmur, click, rub or gallop Lung:chest clear, no wheezing, rales, normal symmetric air entry, no tachypnea, retractions or cyanosis Abdomen: soft, non-tender, without masses or organomegaly EXT:no erythema, induration, or nodules. 2+ edema at the ankle.    Lab Results: Lab Results  Component Value Date   WBC 3.5* 12/31/2012   HGB 9.8* 12/31/2012   HCT  32.5* 12/31/2012   MCV 93.4 12/31/2012   PLT 176 12/31/2012     Chemistry      Component Value Date/Time   NA 141 12/31/2012 0817   NA 140  12/15/2012 1353   K 4.6 12/31/2012 0817   K 3.9 12/15/2012 1353   CL 107 12/31/2012 0817   CL 104 12/15/2012 1353   CO2 27 12/31/2012 0817   CO2 26 12/15/2012 1353   BUN 16.0 12/31/2012 0817   BUN 17 12/15/2012 1353   CREATININE 0.8 12/31/2012 0817   CREATININE 0.87 12/15/2012 1353      Component Value Date/Time   CALCIUM 8.5 12/31/2012 0817   CALCIUM 8.3* 12/15/2012 1353   ALKPHOS 88 12/31/2012 0817   ALKPHOS 102 12/15/2012 1353   AST 20 12/31/2012 0817   AST 18 12/15/2012 1353   ALT 9 12/31/2012 0817   ALT 9 12/15/2012 1353   BILITOT 0.73 12/31/2012 0817   BILITOT 1.2 12/15/2012 1353      Results for Aaron Schaefer (MRN [id number]) as of 12/19/2012 15:42  Ref. Range 09/11/2012 10:46 10/07/2012 08:54 10/29/2012 13:27 11/19/2012 11:17 12/10/2012 09:42  PSA Latest Range: <=4.00 ng/mL 50.68 (H) 44.32 (H) 28.00 (H) 23.32 (H) 18.16 (H)   Impression and Plan: This is Schaefer 79 year old gentleman with the following issues:  1. Castration resistant prostate cancer. He is currently on Taxotere. Now developing worsening fatigue, neuropathy,  And edema. Plan to hold Taxotere at this time. Will discuss further treatment for prostate cancer once he improves.  2. Pneumonia. Improved with completion of Levaquin. He may continue to use over-the-counter cough medicine as needed.  3. Protein calorie malnutrition. I have recommended Ensure or Boost 2-3 times per day. He has declined Marinol. Weight is stable.  4. Bony disease. He remains on Xgeva monthly at [facility].  5. Androgen deprivation. He remains on Lupron at [facility].  6. Anemia. Due to chemotherapy. No active bleeding. No transfusion is indicated.  7. LE edema. Due to Taxotere. I have encouraged him to elevate legs and use compression stockings. This should improve with stopping Taxotere. Encouraged him to start Lasix per PCP.  8. Follow-up. 2/11 as scheduled.     Aaron Schaefer 1/21/20142:59 PM

## 2013-01-01 ENCOUNTER — Ambulatory Visit: Payer: Medicare Other

## 2013-01-01 ENCOUNTER — Telehealth: Payer: Self-pay | Admitting: *Deleted

## 2013-01-01 NOTE — Telephone Encounter (Signed)
Gave wife louise results of PSA done 12/31/12

## 2013-01-01 NOTE — Telephone Encounter (Signed)
Message copied by Reesa Chew on Wed Jan 01, 2013 10:48 AM ------      Message from: Benjiman Core      Created: Wed Jan 01, 2013  8:27 AM       Please call his PSA

## 2013-01-06 ENCOUNTER — Telehealth: Payer: Self-pay | Admitting: *Deleted

## 2013-01-06 ENCOUNTER — Encounter: Payer: Self-pay | Admitting: *Deleted

## 2013-01-06 NOTE — Telephone Encounter (Signed)
Patient calling to report having diarrhea all last night. Is taking lomotil, seems to have resolved today. He is unsure about taking his diuretic. Per dr Clelia Croft, ok to take lasix, hydrate well with sport drinks, take lomotil as needed.

## 2013-01-21 ENCOUNTER — Other Ambulatory Visit: Payer: Self-pay | Admitting: Oncology

## 2013-01-21 ENCOUNTER — Ambulatory Visit: Payer: Medicare Other

## 2013-01-21 ENCOUNTER — Ambulatory Visit (HOSPITAL_BASED_OUTPATIENT_CLINIC_OR_DEPARTMENT_OTHER): Payer: Medicare Other | Admitting: Oncology

## 2013-01-21 ENCOUNTER — Other Ambulatory Visit (HOSPITAL_BASED_OUTPATIENT_CLINIC_OR_DEPARTMENT_OTHER): Payer: Medicare Other | Admitting: Lab

## 2013-01-21 ENCOUNTER — Telehealth: Payer: Self-pay | Admitting: Oncology

## 2013-01-21 VITALS — BP 105/69 | HR 98 | Temp 96.8°F | Resp 20 | Ht 65.5 in | Wt 182.6 lb

## 2013-01-21 DIAGNOSIS — C61 Malignant neoplasm of prostate: Secondary | ICD-10-CM

## 2013-01-21 DIAGNOSIS — R609 Edema, unspecified: Secondary | ICD-10-CM

## 2013-01-21 DIAGNOSIS — C7952 Secondary malignant neoplasm of bone marrow: Secondary | ICD-10-CM

## 2013-01-21 DIAGNOSIS — D6481 Anemia due to antineoplastic chemotherapy: Secondary | ICD-10-CM

## 2013-01-21 LAB — COMPREHENSIVE METABOLIC PANEL (CC13)
ALT: 9 U/L (ref 0–55)
AST: 17 U/L (ref 5–34)
CO2: 28 mEq/L (ref 22–29)
Chloride: 107 mEq/L (ref 98–107)
Creatinine: 0.9 mg/dL (ref 0.7–1.3)
Sodium: 143 mEq/L (ref 136–145)
Total Bilirubin: 0.74 mg/dL (ref 0.20–1.20)
Total Protein: 6.5 g/dL (ref 6.4–8.3)

## 2013-01-21 LAB — CBC WITH DIFFERENTIAL/PLATELET
BASO%: 1.5 % (ref 0.0–2.0)
EOS%: 11.4 % — ABNORMAL HIGH (ref 0.0–7.0)
LYMPH%: 20.4 % (ref 14.0–49.0)
MCH: 27.2 pg (ref 27.2–33.4)
MCHC: 30.1 g/dL — ABNORMAL LOW (ref 32.0–36.0)
MONO#: 0.6 10*3/uL (ref 0.1–0.9)
NEUT%: 51.7 % (ref 39.0–75.0)
RBC: 3.97 10*6/uL — ABNORMAL LOW (ref 4.20–5.82)
WBC: 4.1 10*3/uL (ref 4.0–10.3)
lymph#: 0.8 10*3/uL — ABNORMAL LOW (ref 0.9–3.3)

## 2013-01-21 LAB — PSA: PSA: 22.8 ng/mL — ABNORMAL HIGH (ref <=4.00)

## 2013-01-21 NOTE — Telephone Encounter (Signed)
Gave pt appt for MArch 2014 lab and MD

## 2013-01-21 NOTE — Progress Notes (Signed)
Hematology and Oncology Follow Up Visit  Aaron Schaefer [id number] 03-Jul-1933 79 y.o. 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 Initial diagnosis was in 2001. Gleason score is 3+4 = 7. PSA was 4.96.  Prior Therapy:  1. S/P prostateectomy followed by radiation therapy at the time of diagnosis. 2. Started on degarelix on 11/21/10 due to Schaefer rising PSA. This was switched to Lupron in March 2012 due to pain at the injection site. 3. He developed bone mets at T3, T4, & T5 and was started on Casodex and Xgeva on 12/22/11. 4. Received Provenge 02/15/12 through 03/14/12. 5. He received Xtandi from June 2013 to September 2013.  Current therapy: Systemic chemotherapy with Taxotere started on 09/16/12. S/P 5 cycles on hold for now. He remains on Lupron and Xgeva at [facility].  Interim History:  Aaron Schaefer is seen today for routine follow-up. The patient reports his weakness is Schaefer lot better. He still has lower extremity edema which is improving and he is able to wear hoes now. He is on Lasix and K supplement. No fevers or chills noted. No SOB or dyspnea.  He also reports numbness and pain to his feet and fingertips. No urinary complaints. No hematuria. Appetite is improving his weight is getting better.   Medications: I have reviewed the patient's current medications. Current outpatient prescriptions:calcium carbonate (OS-CAL) 600 MG TABS, Take 600 mg by mouth 2 (two) times daily with Schaefer meal., Disp: , Rfl: ;  furosemide (LASIX) 40 MG tablet, Take 40 mg by mouth daily., Disp: , Rfl: ;  loperamide (IMODIUM Schaefer-D) 2 MG tablet, Take 2 mg by mouth 4 (four) times daily as needed., Disp: , Rfl: ;  ondansetron (ZOFRAN) 8 MG tablet, Take 8 mg by mouth every 8 (eight) hours as needed., Disp: , Rfl:  simvastatin (ZOCOR) 80 MG tablet, Take 80 mg by mouth at bedtime., Disp: , Rfl:   Allergies: No Known Allergies  Past  Medical History, Surgical history, Social history, and Family History were reviewed and updated.  Review of Systems: Constitutional:  Negative for fever, chills, night sweats, anorexia, weight loss, pain. Cardiovascular: no chest pain or dyspnea on exertion Respiratory: no cough, shortness of breath, or wheezing Neurological: no TIA or stroke symptoms Dermatological: negative ENT: negative Skin: Negative. Gastrointestinal: no abdominal pain, change in bowel habits, or black or bloody stools Genito-Urinary: no dysuria, trouble voiding, or hematuria Hematological and Lymphatic: negative Breast: negative for breast lumps Musculoskeletal: negative Remaining ROS negative.  Physical Exam: Blood pressure 105/69, pulse 98, temperature 96.8 F (36 C), resp. rate 20, height 5' 5.5" (1.664 m), weight 182 lb 9.6 oz (82.827 kg). ECOG: 0-1 General appearance: alert, cooperative and no distress Head: Normocephalic, without obvious abnormality, atraumatic Neck: no adenopathy, no carotid bruit, no JVD, supple, symmetrical, trachea midline and thyroid not enlarged, symmetric, no tenderness/mass/nodules Lymph nodes: Cervical, supraclavicular, and axillary nodes normal. Heart:regular rate and rhythm, S1, S2 normal, no murmur, click, rub or gallop Lung:chest clear, no wheezing, rales, normal symmetric air entry, no tachypnea, retractions or cyanosis Abdomen: soft, non-tender, without masses or organomegaly EXT:no erythema, induration, or nodules. 2+ edema at the ankle.    Lab Results: Lab Results  Component Value Date   WBC 4.1 01/21/2013   HGB 10.8* 01/21/2013   HCT 35.9* 01/21/2013   MCV 90.4 01/21/2013   PLT 187 01/21/2013     Chemistry      Component Value  Date/Time   NA 143 01/21/2013 0858   NA 140 12/15/2012 1353   K 4.5 01/21/2013 0858   K 3.9 12/15/2012 1353   CL 107 01/21/2013 0858   CL 104 12/15/2012 1353   CO2 28 01/21/2013 0858   CO2 26 12/15/2012 1353   BUN 21.0 01/21/2013 0858   BUN 17  12/15/2012 1353   CREATININE 0.9 01/21/2013 0858   CREATININE 0.87 12/15/2012 1353      Component Value Date/Time   CALCIUM 8.7 01/21/2013 0858   CALCIUM 8.3* 12/15/2012 1353   ALKPHOS 106 01/21/2013 0858   ALKPHOS 102 12/15/2012 1353   AST 17 01/21/2013 0858   AST 18 12/15/2012 1353   ALT 9 01/21/2013 0858   ALT 9 12/15/2012 1353   BILITOT 0.74 01/21/2013 0858   BILITOT 1.2 12/15/2012 1353     Results for Aaron Schaefer, Aaron Schaefer (MRN [id number]) as of 01/21/2013 09:46  Ref. Range 11/19/2012 11:17 12/10/2012 09:42 12/31/2012 08:17  PSA Latest Range: <=4.00 ng/mL 23.32 (H) 18.16 (H) 15.28 (H)     Impression and Plan: This is Schaefer 79 year old gentleman with the following issues:  1. Castration resistant prostate cancer. He is S/P Taxotere for 5 cycles. Therapy stopped due to worsening fatigue, neuropathy,  And edema. His symptoms are improving slowly. His PSA has dropped to15 form 50. For now, we will continue observation and we can use Schaefer different salvage therapy if his PSA starts to rise. These would include Zytiga vs Xofigo.   2. Pneumonia. Improved with completion of Levaquin.   3. Protein calorie malnutrition. improved  4. Bony disease. He remains on Xgeva monthly at [facility].  5. Androgen deprivation. He remains on Lupron at [facility].  6. Anemia. Due to chemotherapy. No active bleeding. No transfusion is indicated.  7. LE edema. Improved on lasix. He is to continue that for now.   8. Follow-up. 3/18 for Schaefer check up.      Aaron Schaefer 2/11/20149:44 AM

## 2013-01-22 ENCOUNTER — Ambulatory Visit: Payer: Medicare Other

## 2013-01-25 ENCOUNTER — Other Ambulatory Visit: Payer: Self-pay

## 2013-02-25 ENCOUNTER — Telehealth: Payer: Self-pay | Admitting: Oncology

## 2013-02-25 ENCOUNTER — Other Ambulatory Visit (HOSPITAL_BASED_OUTPATIENT_CLINIC_OR_DEPARTMENT_OTHER): Payer: Medicare Other | Admitting: Lab

## 2013-02-25 ENCOUNTER — Ambulatory Visit (HOSPITAL_BASED_OUTPATIENT_CLINIC_OR_DEPARTMENT_OTHER): Payer: Medicare Other | Admitting: Oncology

## 2013-02-25 VITALS — BP 125/86 | HR 89 | Temp 97.0°F | Resp 19 | Ht 65.0 in | Wt 180.2 lb

## 2013-02-25 DIAGNOSIS — C61 Malignant neoplasm of prostate: Secondary | ICD-10-CM

## 2013-02-25 DIAGNOSIS — R609 Edema, unspecified: Secondary | ICD-10-CM

## 2013-02-25 DIAGNOSIS — D6481 Anemia due to antineoplastic chemotherapy: Secondary | ICD-10-CM

## 2013-02-25 DIAGNOSIS — E291 Testicular hypofunction: Secondary | ICD-10-CM

## 2013-02-25 LAB — COMPREHENSIVE METABOLIC PANEL (CC13)
ALT: 11 U/L (ref 0–55)
AST: 20 U/L (ref 5–34)
Alkaline Phosphatase: 135 U/L (ref 40–150)
BUN: 13.3 mg/dL (ref 7.0–26.0)
Calcium: 8.8 mg/dL (ref 8.4–10.4)
Chloride: 105 mEq/L (ref 98–107)
Creatinine: 0.9 mg/dL (ref 0.7–1.3)
Total Bilirubin: 0.82 mg/dL (ref 0.20–1.20)

## 2013-02-25 LAB — CBC WITH DIFFERENTIAL/PLATELET
BASO%: 1.1 % (ref 0.0–2.0)
Basophils Absolute: 0.1 10*3/uL (ref 0.0–0.1)
EOS%: 2.7 % (ref 0.0–7.0)
HCT: 36.7 % — ABNORMAL LOW (ref 38.4–49.9)
HGB: 11.7 g/dL — ABNORMAL LOW (ref 13.0–17.1)
LYMPH%: 26.5 % (ref 14.0–49.0)
MCH: 27.6 pg (ref 27.2–33.4)
MCHC: 32 g/dL (ref 32.0–36.0)
MCV: 86.2 fL (ref 79.3–98.0)
MONO%: 8.6 % (ref 0.0–14.0)
NEUT%: 61.1 % (ref 39.0–75.0)
Platelets: 180 10*3/uL (ref 140–400)
lymph#: 1.5 10*3/uL (ref 0.9–3.3)

## 2013-02-25 MED ORDER — ABIRATERONE ACETATE 250 MG PO TABS: 1000.0000 mg | ORAL_TABLET | Freq: Every day | ORAL | Status: DC

## 2013-02-25 NOTE — Telephone Encounter (Signed)
gve the pt his April 2014 appt calendar °

## 2013-02-25 NOTE — Progress Notes (Signed)
Hematology and Oncology Follow Up Visit  Aaron Schaefer [id number] [birth year] 79 y.o. 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 Initial diagnosis was in 2001. Gleason score is 3+4 = 7. PSA was 4.96.  Prior Therapy:  1. S/P prostateectomy followed by radiation therapy at the time of diagnosis. 2. Started on degarelix on 11/21/10 due to Schaefer rising PSA. This was switched to Lupron in March 2012 due to pain at the injection site. 3. He developed bone mets at T3, T4, & T5 and was started on Casodex and Xgeva on 12/22/11. 4. Received Provenge 02/15/12 through 03/14/12. 5. He received Xtandi from June 2013 to September 2013. 6. S/P Systemic chemotherapy with Taxotere started on 09/16/12. S/P 5 cycles. Treatment stopped in 12/2012 due to poor tolerance.   Current therapy:  He remains on Lupron and Xgeva at [facility].  Interim History:  Aaron Schaefer is seen today for routine follow-up. The patient reports his weakness is Schaefer lot better. He still has lower extremity edema which is improving and he is able to wear shoes better. He is on Lasix and K supplement. No fevers or chills noted. No SOB or dyspnea.  He also reports numbness and pain to his feet and fingertips that has improved. No urinary complaints. No hematuria. Appetite is improving his weight is getting better.   Medications: I have reviewed the patient's current medications. Current outpatient prescriptions:abiraterone Acetate (ZYTIGA) 250 MG tablet, Take 4 tablets (1,000 mg total) by mouth daily. Take on an empty stomach 1 hour before or 2 hours after Schaefer meal, Disp: 120 tablet, Rfl: 0;  calcium carbonate (OS-CAL) 600 MG TABS, Take 600 mg by mouth 2 (two) times daily with Schaefer meal., Disp: , Rfl: ;  furosemide (LASIX) 40 MG tablet, Take 40 mg by mouth daily., Disp: , Rfl:  loperamide (IMODIUM Schaefer-D) 2 MG tablet, Take 2 mg by mouth 4 (four) times daily as  needed., Disp: , Rfl: ;  ondansetron (ZOFRAN) 8 MG tablet, Take 8 mg by mouth every 8 (eight) hours as needed., Disp: , Rfl: ;  simvastatin (ZOCOR) 80 MG tablet, Take 80 mg by mouth at bedtime., Disp: , Rfl:   Allergies: No Known Allergies  Past Medical History, Surgical history, Social history, and Family History were reviewed and updated.  Review of Systems: Constitutional:  Negative for fever, chills, night sweats, anorexia, weight loss, pain. Cardiovascular: no chest pain or dyspnea on exertion Respiratory: no cough, shortness of breath, or wheezing Neurological: no TIA or stroke symptoms Dermatological: negative ENT: negative Skin: Negative. Gastrointestinal: no abdominal pain, change in bowel habits, or black or bloody stools Genito-Urinary: no dysuria, trouble voiding, or hematuria Hematological and Lymphatic: negative Breast: negative for breast lumps Musculoskeletal: negative Remaining ROS negative.  Physical Exam: Blood pressure 125/86, pulse 89, temperature 97 F (36.1 C), temperature source Oral, resp. rate 19, height 5\' 5"  (1.651 m), weight 180 lb 3.2 oz (81.738 kg). ECOG: 0-1 General appearance: alert, cooperative and no distress Head: Normocephalic, without obvious abnormality, atraumatic Neck: no adenopathy, no carotid bruit, no JVD, supple, symmetrical, trachea midline and thyroid not enlarged, symmetric, no tenderness/mass/nodules Lymph nodes: Cervical, supraclavicular, and axillary nodes normal. Heart:regular rate and rhythm, S1, S2 normal, no murmur, click, rub or gallop Lung:chest clear, no wheezing, rales, normal symmetric air entry, no tachypnea, retractions or cyanosis Abdomen: soft, non-tender, without masses or organomegaly EXT:no erythema, induration, or nodules. 2+ edema at the  ankle.    Lab Results: Lab Results  Component Value Date   WBC 5.5 02/25/2013   HGB 11.7* 02/25/2013   HCT 36.7* 02/25/2013   MCV 86.2 02/25/2013   PLT 180 02/25/2013      Chemistry      Component Value Date/Time   NA 141 02/25/2013 1441   NA 140 12/15/2012 1353   K 4.0 02/25/2013 1441   K 3.9 12/15/2012 1353   CL 105 02/25/2013 1441   CL 104 12/15/2012 1353   CO2 28 02/25/2013 1441   CO2 26 12/15/2012 1353   BUN 13.3 02/25/2013 1441   BUN 17 12/15/2012 1353   CREATININE 0.9 02/25/2013 1441   CREATININE 0.87 12/15/2012 1353      Component Value Date/Time   CALCIUM 8.8 02/25/2013 1441   CALCIUM 8.3* 12/15/2012 1353   ALKPHOS 135 02/25/2013 1441   ALKPHOS 102 12/15/2012 1353   AST 20 02/25/2013 1441   AST 18 12/15/2012 1353   ALT 11 02/25/2013 1441   ALT 9 12/15/2012 1353   BILITOT 0.82 02/25/2013 1441   BILITOT 1.2 12/15/2012 1353      Results for Aaron Schaefer (MRN [id number]) as of 02/25/2013 15:39  Ref. Range 12/10/2012 09:42 12/31/2012 08:17 01/21/2013 08:58  PSA Latest Range: <=4.00 ng/mL 18.16 (H) 15.28 (H) 22.80 (H)     Impression and Plan: This is Schaefer 79 year old gentleman with the following issues:  1. Castration resistant prostate cancer. He is S/P Taxotere for 5 cycles. Therapy stopped due to worsening fatigue, neuropathy,  And edema. His symptoms are improving slowly. His PSA has dropped to15 form 50 but now it is up to 22. I discussed treatment options with him including [name], Zytiga and Jevtana. He have elected to proceed with Zytiga. Risks and benefits discussed, written information given. He is ready to proceed.    2. Protein calorie malnutrition. improved  4. Bony disease. He remains on Xgeva monthly at [facility].  5. Androgen deprivation. He remains on Lupron at [facility].  6. Anemia. Due to chemotherapy. No active bleeding. No transfusion is indicated. Hgb is improving.  7. LE edema. Improved on lasix. He is to continue that for now.   8. Follow-up. In 4-5 weeks to check tolerance to Zytiga.      Aaron Schaefer 3/18/20143:36 PM

## 2013-02-26 ENCOUNTER — Encounter: Payer: Self-pay | Admitting: Oncology

## 2013-02-26 NOTE — Progress Notes (Signed)
Faxed zytiga prescription to [facility] °

## 2013-02-28 ENCOUNTER — Telehealth: Payer: Self-pay | Admitting: *Deleted

## 2013-02-28 NOTE — Telephone Encounter (Signed)
Spoke with louise. Let her know that per dr Clelia Croft, if he has to pay $2700.00 each month, then don't get the zytiga, but if it is just this one time and can be gotten for much less for consecutive treatments, then we should proceed. Per ebony it will be just for this time. Wife verbalized understanding.

## 2013-03-11 ENCOUNTER — Telehealth: Payer: Self-pay | Admitting: Oncology

## 2013-03-24 ENCOUNTER — Other Ambulatory Visit: Payer: Self-pay | Admitting: *Deleted

## 2013-03-24 NOTE — Telephone Encounter (Signed)
THIS REFILL REQUEST FOR ZYTIGA WAS PLACED IN DR.SHADAD'S ACTIVE WORK FOLDER. 

## 2013-03-25 ENCOUNTER — Other Ambulatory Visit: Payer: Self-pay | Admitting: *Deleted

## 2013-03-25 ENCOUNTER — Encounter: Payer: Self-pay | Admitting: *Deleted

## 2013-03-25 MED ORDER — ABIRATERONE ACETATE 250 MG PO TABS: 1000.0000 mg | ORAL_TABLET | Freq: Every day | ORAL | Status: DC

## 2013-03-25 NOTE — Telephone Encounter (Signed)
Faxed signed script to [facility] [phone] for zytiga

## 2013-03-31 ENCOUNTER — Encounter: Payer: Self-pay | Admitting: Oncology

## 2013-04-01 ENCOUNTER — Other Ambulatory Visit: Payer: Medicare Other | Admitting: Lab

## 2013-04-01 ENCOUNTER — Telehealth: Payer: Self-pay | Admitting: *Deleted

## 2013-04-01 ENCOUNTER — Ambulatory Visit: Payer: Medicare Other | Admitting: Oncology

## 2013-04-01 NOTE — Telephone Encounter (Signed)
Faxed last o.v. Note to carroll at [facility]. [phone]

## 2013-04-03 ENCOUNTER — Telehealth: Payer: Self-pay | Admitting: Oncology

## 2013-04-03 ENCOUNTER — Ambulatory Visit (HOSPITAL_BASED_OUTPATIENT_CLINIC_OR_DEPARTMENT_OTHER): Payer: Medicare Other | Admitting: Oncology

## 2013-04-03 ENCOUNTER — Other Ambulatory Visit (HOSPITAL_BASED_OUTPATIENT_CLINIC_OR_DEPARTMENT_OTHER): Payer: Medicare Other | Admitting: Lab

## 2013-04-03 ENCOUNTER — Encounter: Payer: Self-pay | Admitting: Oncology

## 2013-04-03 VITALS — BP 134/76 | HR 83 | Temp 98.1°F | Resp 18 | Ht 65.0 in | Wt 178.9 lb

## 2013-04-03 DIAGNOSIS — D6481 Anemia due to antineoplastic chemotherapy: Secondary | ICD-10-CM

## 2013-04-03 DIAGNOSIS — C61 Malignant neoplasm of prostate: Secondary | ICD-10-CM

## 2013-04-03 DIAGNOSIS — R609 Edema, unspecified: Secondary | ICD-10-CM

## 2013-04-03 DIAGNOSIS — M899 Disorder of bone, unspecified: Secondary | ICD-10-CM

## 2013-04-03 LAB — COMPREHENSIVE METABOLIC PANEL (CC13)
AST: 20 U/L (ref 5–34)
Alkaline Phosphatase: 230 U/L — ABNORMAL HIGH (ref 40–150)
BUN: 15.7 mg/dL (ref 7.0–26.0)
Creatinine: 1.1 mg/dL (ref 0.7–1.3)
Potassium: 4 mEq/L (ref 3.5–5.1)

## 2013-04-03 LAB — CBC WITH DIFFERENTIAL/PLATELET
Basophils Absolute: 0 10*3/uL (ref 0.0–0.1)
EOS%: 4.8 % (ref 0.0–7.0)
HGB: 11.5 g/dL — ABNORMAL LOW (ref 13.0–17.1)
MCH: 26.5 pg — ABNORMAL LOW (ref 27.2–33.4)
MCV: 84.4 fL (ref 79.3–98.0)
MONO%: 8.2 % (ref 0.0–14.0)
RDW: 14.5 % (ref 11.0–14.6)

## 2013-04-03 MED ORDER — FUROSEMIDE 20 MG PO TABS: 20.0000 mg | ORAL_TABLET | Freq: Every day | ORAL | Status: DC

## 2013-04-03 NOTE — Patient Instructions (Addendum)
Swelling: Take Lasix 40  mg every other day until prescription runs out. Then begin Lasix 20 mg daily. Prescription has been sent to [facility] [address]  Dry skin: Use Moisturizer like Lubriderm or Eucerin 2-3 times per day.  Nail: You may apply tea tree oil to the nails daily.

## 2013-04-03 NOTE — Progress Notes (Signed)
Hematology and Oncology Follow Up Visit  NOWELL SITES [id number] [birth year] 79 y.o. 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 Initial diagnosis was in 2001. Gleason score is 3+4 = 7. PSA was 4.96.  Prior Therapy:  1. S/P prostateectomy followed by radiation therapy at the time of diagnosis. 2. Started on degarelix on 11/21/10 due to a rising PSA. This was switched to Lupron in March 2012 due to pain at the injection site. 3. He developed bone mets at T3, T4, & T5 and was started on Casodex and Xgeva on 12/22/11. 4. Received Provenge 02/15/12 through 03/14/12. 5. He received Xtandi from June 2013 to September 2013. 6. S/P Systemic chemotherapy with Taxotere started on 09/16/12. S/P 5 cycles. Treatment stopped in 12/2012 due to poor tolerance.   Current therapy:  Started Zytiga 1000 mg daily on 03/03/13. He remains on Lupron and Xgeva at [facility].  Interim History:  Mr Ginley is seen today for routine follow-up. The patient reports his weakness is a lot better. He still has lower extremity edema which is improving and he is able to wear shoes better. He is on Lasix and K supplement. Reports dry skin to his lower extremities and hands. Has nail changes due to prior Taxotere. No fevers or chills noted. No SOB or dyspnea.  He also reports numbness and pain to his feet and fingertips that has improved. No urinary complaints. No hematuria. Appetite is improving his weight is getting better.   Medications: I have reviewed the patient's current medications. Current outpatient prescriptions:abiraterone Acetate (ZYTIGA) 250 MG tablet, Take 4 tablets (1,000 mg total) by mouth daily. Take on an empty stomach 1 hour before or 2 hours after a meal, Disp: 120 tablet, Rfl: 0;  calcium carbonate (OS-CAL) 600 MG TABS, Take 600 mg by mouth 2 (two) times daily with a meal., Disp: , Rfl: ;  furosemide (LASIX) 20  MG tablet, Take 1 tablet (20 mg total) by mouth daily., Disp: 30 tablet, Rfl: 2 loperamide (IMODIUM A-D) 2 MG tablet, Take 2 mg by mouth 4 (four) times daily as needed., Disp: , Rfl: ;  ondansetron (ZOFRAN) 8 MG tablet, Take 8 mg by mouth every 8 (eight) hours as needed., Disp: , Rfl: ;  simvastatin (ZOCOR) 80 MG tablet, Take 80 mg by mouth at bedtime., Disp: , Rfl:   Allergies: No Known Allergies  Past Medical History, Surgical history, Social history, and Family History were reviewed and updated.  Review of Systems: Constitutional:  Negative for fever, chills, night sweats, anorexia, weight loss, pain. Cardiovascular: no chest pain or dyspnea on exertion Respiratory: no cough, shortness of breath, or wheezing Neurological: no TIA or stroke symptoms Dermatological: negative ENT: negative Skin: Negative. Gastrointestinal: no abdominal pain, change in bowel habits, or black or bloody stools Genito-Urinary: no dysuria, trouble voiding, or hematuria Hematological and Lymphatic: negative Breast: negative for breast lumps Musculoskeletal: negative Remaining ROS negative.  Physical Exam: Blood pressure 134/76, pulse 83, temperature 98.1 F (36.7 C), temperature source Oral, resp. rate 18, height 5\' 5"  (1.651 m), weight 178 lb 14.4 oz (81.149 kg). ECOG: 0-1 General appearance: alert, cooperative and no distress Head: Normocephalic, without obvious abnormality, atraumatic Neck: no adenopathy, no carotid bruit, no JVD, supple, symmetrical, trachea midline and thyroid not enlarged, symmetric, no tenderness/mass/nodules Lymph nodes: Cervical, supraclavicular, and axillary nodes normal. Heart:regular rate and rhythm, S1, S2 normal, no murmur, click, rub or gallop Lung:chest clear, no  wheezing, rales, normal symmetric air entry, no tachypnea, retractions or cyanosis Abdomen: soft, non-tender, without masses or organomegaly EXT:no erythema, induration, or nodules. Trace edema to the BLE.   Lab  Results: Lab Results  Component Value Date   WBC 5.6 04/03/2013   HGB 11.5* 04/03/2013   HCT 36.6* 04/03/2013   MCV 84.4 04/03/2013   PLT 261 04/03/2013     Chemistry      Component Value Date/Time   NA 141 04/03/2013 0810   NA 140 12/15/2012 1353   K 4.0 04/03/2013 0810   K 3.9 12/15/2012 1353   CL 102 04/03/2013 0810   CL 104 12/15/2012 1353   CO2 29 04/03/2013 0810   CO2 26 12/15/2012 1353   BUN 15.7 04/03/2013 0810   BUN 17 12/15/2012 1353   CREATININE 1.1 04/03/2013 0810   CREATININE 0.87 12/15/2012 1353      Component Value Date/Time   CALCIUM 9.0 04/03/2013 0810   CALCIUM 8.3* 12/15/2012 1353   ALKPHOS 230* 04/03/2013 0810   ALKPHOS 102 12/15/2012 1353   AST 20 04/03/2013 0810   AST 18 12/15/2012 1353   ALT 8 04/03/2013 0810   ALT 9 12/15/2012 1353   BILITOT 0.86 04/03/2013 0810   BILITOT 1.2 12/15/2012 1353     Results for Nicoll, Seyed A (MRN [id number]) as of 04/03/2013 08:36  Ref. Range 12/10/2012 09:42 12/31/2012 08:17 01/21/2013 08:58 02/25/2013 14:41  PSA Latest Range: <=4.00 ng/mL 18.16 (H) 15.28 (H) 22.80 (H) 88.54 (H)    Impression and Plan: This is a 79 year old gentleman with the following issues:  1. Castration resistant prostate cancer. He is S/P Taxotere for 5 cycles. Therapy stopped due to worsening fatigue, neuropathy, and edema. His symptoms are improving slowly. His PSA has dropped to15 form 50 but now it is up to 88. He has started [place] and is tolerating well. Recommend that he continue Zytiga without dose modification. PSA is pending today.    2. Protein calorie malnutrition. improved  4. Bony disease. He remains on Xgeva monthly at [facility].  5. Androgen deprivation. He remains on Lupron at [facility].  6. Anemia. Due to chemotherapy. No active bleeding. No transfusion is indicated. Hgb is improving.  7. LE edema. Improved with Lasix. Now developing dry skin. I have asked him to decrease his Lasix to 20 mg daily. Use moisturizer to skin 2-3 times per  day.  8. Nail changes. Due to prior Taxotere. He may use tea tree oil to his nails.   9. Follow-up. In 4-5 weeks to check tolerance to Zytiga.      [place], [place] 4/24/20149:14 AM

## 2013-04-04 ENCOUNTER — Telehealth: Payer: Self-pay | Admitting: *Deleted

## 2013-04-04 NOTE — Telephone Encounter (Deleted)
Message copied by Reesa Chew on Fri Apr 04, 2013 11:55 AM ------      Message from: Benjiman Core      Created: Fri Apr 04, 2013  8:14 AM       Please call his PSA ------

## 2013-04-04 NOTE — Telephone Encounter (Signed)
Message copied by Reesa Chew on Fri Apr 04, 2013 11:57 AM ------      Message from: Benjiman Core      Created: Fri Apr 04, 2013  8:14 AM       Please call his PSA ------

## 2013-04-04 NOTE — Telephone Encounter (Signed)
Spoke with patient, gave results of PSA done  04/03/13

## 2013-04-07 ENCOUNTER — Telehealth: Payer: Self-pay | Admitting: *Deleted

## 2013-04-07 NOTE — Telephone Encounter (Signed)
Pt called to ask what pharmacy his Roosvelt Maser came from?  He needs to call them to re order.  According to our notes, the Rx was sent to [facility].  Gave pt the phone number for [facility] and asked him to call back if any problems.  He verbalized understanding.

## 2013-04-08 ENCOUNTER — Encounter: Payer: Self-pay | Admitting: *Deleted

## 2013-04-08 NOTE — Progress Notes (Signed)
RECEIVED A FAX FROM [facility] CONCERNING A CONFIRMATION OF PRESCRIPTION SHIPMENT FOR ZYTIGA ON 04/07/13.

## 2013-05-06 ENCOUNTER — Other Ambulatory Visit: Payer: Self-pay | Admitting: *Deleted

## 2013-05-06 ENCOUNTER — Telehealth: Payer: Self-pay | Admitting: *Deleted

## 2013-05-06 DIAGNOSIS — C61 Malignant neoplasm of prostate: Secondary | ICD-10-CM

## 2013-05-06 MED ORDER — ABIRATERONE ACETATE 250 MG PO TABS: 1000.0000 mg | ORAL_TABLET | Freq: Every day | ORAL | Status: DC

## 2013-05-06 NOTE — Telephone Encounter (Signed)
[facility] faxed confirmation of referral.  Will verify insurance and make delivery arrangements. 

## 2013-05-06 NOTE — Telephone Encounter (Signed)
[facility] faxed Zytiga refill request.  This request to MD for review.

## 2013-05-08 ENCOUNTER — Telehealth: Payer: Self-pay | Admitting: Oncology

## 2013-05-08 ENCOUNTER — Ambulatory Visit (HOSPITAL_BASED_OUTPATIENT_CLINIC_OR_DEPARTMENT_OTHER): Payer: Medicare Other | Admitting: Oncology

## 2013-05-08 ENCOUNTER — Other Ambulatory Visit (HOSPITAL_BASED_OUTPATIENT_CLINIC_OR_DEPARTMENT_OTHER): Payer: Medicare Other | Admitting: Lab

## 2013-05-08 VITALS — BP 126/78 | HR 99 | Temp 97.5°F | Resp 18 | Ht 65.0 in | Wt 175.2 lb

## 2013-05-08 DIAGNOSIS — C61 Malignant neoplasm of prostate: Secondary | ICD-10-CM

## 2013-05-08 DIAGNOSIS — M899 Disorder of bone, unspecified: Secondary | ICD-10-CM

## 2013-05-08 DIAGNOSIS — T451X5A Adverse effect of antineoplastic and immunosuppressive drugs, initial encounter: Secondary | ICD-10-CM

## 2013-05-08 DIAGNOSIS — D630 Anemia in neoplastic disease: Secondary | ICD-10-CM

## 2013-05-08 LAB — COMPREHENSIVE METABOLIC PANEL (CC13)
ALT: 9 U/L (ref 0–55)
AST: 19 U/L (ref 5–34)
Alkaline Phosphatase: 228 U/L — ABNORMAL HIGH (ref 40–150)
CO2: 26 mEq/L (ref 22–29)
Creatinine: 0.9 mg/dL (ref 0.7–1.3)
Sodium: 141 mEq/L (ref 136–145)
Total Bilirubin: 1.75 mg/dL — ABNORMAL HIGH (ref 0.20–1.20)
Total Protein: 7.3 g/dL (ref 6.4–8.3)

## 2013-05-08 LAB — CBC WITH DIFFERENTIAL/PLATELET
BASO%: 1 % (ref 0.0–2.0)
EOS%: 1.5 % (ref 0.0–7.0)
HCT: 36.1 % — ABNORMAL LOW (ref 38.4–49.9)
LYMPH%: 20.2 % (ref 14.0–49.0)
MCH: 27.5 pg (ref 27.2–33.4)
MCHC: 32.2 g/dL (ref 32.0–36.0)
MONO#: 0.6 10*3/uL (ref 0.1–0.9)
MONO%: 9.7 % (ref 0.0–14.0)
NEUT%: 67.6 % (ref 39.0–75.0)
Platelets: 200 10*3/uL (ref 140–400)
RBC: 4.22 10*6/uL (ref 4.20–5.82)
WBC: 6.3 10*3/uL (ref 4.0–10.3)

## 2013-05-08 LAB — PSA: PSA: 123.2 ng/mL — ABNORMAL HIGH (ref <=4.00)

## 2013-05-08 NOTE — Telephone Encounter (Signed)
Gave pt appt for lab and ML on July 2014 °

## 2013-05-08 NOTE — Progress Notes (Signed)
Hematology and Oncology Follow Up Visit  Aaron Schaefer [id number] [birth year] 79 y.o. 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 Initial diagnosis was in 2001. Gleason score is 3+4 = 7. PSA was 4.96.  Prior Therapy:  1. S/P prostateectomy followed by radiation therapy at the time of diagnosis. 2. Started on degarelix on 11/21/10 due to Schaefer rising PSA. This was switched to Lupron in March 2012 due to pain at the injection site. 3. He developed bone mets at T3, T4, & T5 and was started on Casodex and Xgeva on 12/22/11. 4. Received Provenge 02/15/12 through 03/14/12. 5. He received Xtandi from June 2013 to September 2013. 6. S/P Systemic chemotherapy with Taxotere started on 09/16/12. S/P 5 cycles. Treatment stopped in 12/2012 due to poor tolerance.   Current therapy:  Started Zytiga 1000 mg daily on 03/03/13. He remains on Lupron and Xgeva at [facility].  Interim History:  Aaron Schaefer is seen today for routine follow-up. He continues to do well Zytiga.The patient reports his weakness is Schaefer lot better. He still has lower extremity edema which is improving and he is able to wear shoes better. He is on Lasix and K supplement. Reports dry skin to his lower extremities and hands. Has nail changes due to prior Taxotere is improving. No fevers or chills noted. No SOB or dyspnea.  He also reports numbness and pain to his feet and fingertips that has improved. No urinary complaints. No hematuria. Appetite is improving his weight is getting better. No new complications due to Zytiga.   Medications: I have reviewed the patient's current medications.   Current Outpatient Prescriptions  Medication Sig Dispense Refill  . abiraterone Acetate (ZYTIGA) 250 MG tablet Take 4 tablets (1,000 mg total) by mouth daily. Take on an empty stomach 1 hour before or 2 hours after Schaefer meal  120 tablet  0  . calcium carbonate  (OS-CAL) 600 MG TABS Take 600 mg by mouth 2 (two) times daily with Schaefer meal.      . furosemide (LASIX) 20 MG tablet Take 1 tablet (20 mg total) by mouth daily.  30 tablet  2  . loperamide (IMODIUM Schaefer-D) 2 MG tablet Take 2 mg by mouth 4 (four) times daily as needed.      . ondansetron (ZOFRAN) 8 MG tablet Take 8 mg by mouth every 8 (eight) hours as needed.      . simvastatin (ZOCOR) 80 MG tablet Take 80 mg by mouth at bedtime.       No current facility-administered medications for this visit.    Allergies: No Known Allergies  Past Medical History, Surgical history, Social history, and Family History were reviewed and updated.  Review of Systems: Constitutional:  Negative for fever, chills, night sweats, anorexia, weight loss, pain. Cardiovascular: no chest pain or dyspnea on exertion Respiratory: no cough, shortness of breath, or wheezing Neurological: no TIA or stroke symptoms Dermatological: negative ENT: negative Skin: Negative. Gastrointestinal: no abdominal pain, change in bowel habits, or black or bloody stools Genito-Urinary: no dysuria, trouble voiding, or hematuria Hematological and Lymphatic: negative Breast: negative for breast lumps Musculoskeletal: negative Remaining ROS negative.  Physical Exam: Blood pressure 126/78, pulse 99, temperature 97.5 F (36.4 C), temperature source Oral, resp. rate 18, height 5\' 5"  (1.651 m), weight 175 lb 3.2 oz (79.47 kg). ECOG: 0-1 General appearance: alert, cooperative and no distress Head: Normocephalic, without obvious abnormality, atraumatic Neck: no adenopathy,  no carotid bruit, no JVD, supple, symmetrical, trachea midline and thyroid not enlarged, symmetric, no tenderness/mass/nodules Lymph nodes: Cervical, supraclavicular, and axillary nodes normal. Heart:regular rate and rhythm, S1, S2 normal, no murmur, click, rub or gallop Lung:chest clear, no wheezing, rales, normal symmetric air entry, no tachypnea, retractions or  cyanosis Abdomen: soft, non-tender, without masses or organomegaly EXT:no erythema, induration, or nodules. Trace edema to the BLE.   Lab Results: Lab Results  Component Value Date   WBC 6.3 05/08/2013   HGB 11.6* 05/08/2013   HCT 36.1* 05/08/2013   MCV 85.5 05/08/2013   PLT 200 05/08/2013     Chemistry      Component Value Date/Time   NA 141 05/08/2013 0856   NA 140 12/15/2012 1353   K 3.2* 05/08/2013 0856   K 3.9 12/15/2012 1353   CL 105 05/08/2013 0856   CL 104 12/15/2012 1353   CO2 26 05/08/2013 0856   CO2 26 12/15/2012 1353   BUN 13.8 05/08/2013 0856   BUN 17 12/15/2012 1353   CREATININE 0.9 05/08/2013 0856   CREATININE 0.87 12/15/2012 1353      Component Value Date/Time   CALCIUM 8.1* 05/08/2013 0856   CALCIUM 8.3* 12/15/2012 1353   ALKPHOS 228* 05/08/2013 0856   ALKPHOS 102 12/15/2012 1353   AST 19 05/08/2013 0856   AST 18 12/15/2012 1353   ALT 9 05/08/2013 0856   ALT 9 12/15/2012 1353   BILITOT 1.75* 05/08/2013 0856   BILITOT 1.2 12/15/2012 1353      Results for Aaron Schaefer (MRN [id number]) as of 05/08/2013 09:22  Ref. Range 02/25/2013 14:41 04/03/2013 08:10  PSA Latest Range: <=4.00 ng/mL 88.54 (H) 79.28 (H)    Impression and Plan: This is Schaefer 79 year old gentleman with the following issues:  1. Castration resistant prostate cancer. He is S/P Taxotere for 5 cycles. Therapy stopped due to worsening fatigue, neuropathy, and edema. His symptoms are improving slowly. His PSA has dropped from 88 to 79 after one month of Zytiga. I recommend that he continue Zytiga without dose modification. PSA is pending today.    2. Protein calorie malnutrition. improved  4. Bony disease. He remains on Xgeva monthly at [facility].  5. Androgen deprivation. He remains on Lupron at [facility].  6. Anemia. Due to chemotherapy. No active bleeding. No transfusion is indicated. Hgb is improving.  7. LE edema. Improved with Lasix to 20 mg daily. Use moisturizer to skin 2-3 times per day.  8. Nail  changes. Due to prior Taxotere. Improving as well.   9. Follow-up. In 4-5 weeks to check tolerance to Zytiga.      Diann Bangerter 5/29/20149:40 AM

## 2013-05-09 ENCOUNTER — Telehealth: Payer: Self-pay | Admitting: *Deleted

## 2013-05-09 MED ORDER — POTASSIUM CHLORIDE CRYS ER 20 MEQ PO TBCR: 20.0000 meq | EXTENDED_RELEASE_TABLET | Freq: Every day | ORAL | Status: DC

## 2013-05-09 NOTE — Telephone Encounter (Signed)
Message copied by Reesa Chew on Fri May 09, 2013  9:23 AM ------      Message from: Clenton Pare R      Created: Thu May 08, 2013 11:32 AM       Please call pt. K+ is low (probably due to Lasix). He needs to begin KDur 20 Meq daily. What [harmacy should I send RX to? ------

## 2013-05-09 NOTE — Telephone Encounter (Signed)
RECEIVED A FAX FROM [facility] CONCERNING A CONFIRMATION OF PRESCRIPTION SHIPMENT FOR ZYTIGA ON 05/08/13.

## 2013-05-09 NOTE — Telephone Encounter (Signed)
Spoke with patient, potassium is low. Will e-scribe [facility] [address]. See med list

## 2013-05-09 NOTE — Telephone Encounter (Signed)
Message copied by Reesa Chew on Fri May 09, 2013 10:06 AM ------      Message from: Clenton Pare R      Created: Thu May 08, 2013 11:32 AM       Please call pt. K+ is low (probably due to Lasix). He needs to begin KDur 20 Meq daily. What [harmacy should I send RX to? ------

## 2013-05-30 ENCOUNTER — Other Ambulatory Visit: Payer: Self-pay | Admitting: *Deleted

## 2013-05-30 DIAGNOSIS — C61 Malignant neoplasm of prostate: Secondary | ICD-10-CM

## 2013-05-30 MED ORDER — ABIRATERONE ACETATE 250 MG PO TABS: 1000.0000 mg | ORAL_TABLET | Freq: Every day | ORAL | Status: DC

## 2013-05-30 NOTE — Telephone Encounter (Signed)
Refill request for zytiga placed on Dr. Alver Fisher desk for approval.

## 2013-05-30 NOTE — Telephone Encounter (Signed)
Script for zytiga faxed to [facility] [phone] 

## 2013-06-05 ENCOUNTER — Telehealth: Payer: Self-pay | Admitting: *Deleted

## 2013-06-05 NOTE — Telephone Encounter (Signed)
RECEIVED A FAX FROM [facility] CONCERNING A CONFIRMATION OF A PRESCRIPTION SHIPMENT FOR ZYTIGA FOR DELIVERY ON 06/04/13.

## 2013-06-12 ENCOUNTER — Ambulatory Visit (HOSPITAL_BASED_OUTPATIENT_CLINIC_OR_DEPARTMENT_OTHER): Payer: Medicare Other | Admitting: Oncology

## 2013-06-12 ENCOUNTER — Other Ambulatory Visit (HOSPITAL_BASED_OUTPATIENT_CLINIC_OR_DEPARTMENT_OTHER): Payer: Medicare Other | Admitting: Lab

## 2013-06-12 ENCOUNTER — Encounter: Payer: Self-pay | Admitting: Oncology

## 2013-06-12 ENCOUNTER — Telehealth: Payer: Self-pay | Admitting: Oncology

## 2013-06-12 VITALS — BP 141/93 | HR 83 | Temp 97.5°F | Resp 18 | Ht 65.0 in | Wt 175.1 lb

## 2013-06-12 DIAGNOSIS — M949 Disorder of cartilage, unspecified: Secondary | ICD-10-CM

## 2013-06-12 DIAGNOSIS — R609 Edema, unspecified: Secondary | ICD-10-CM

## 2013-06-12 DIAGNOSIS — M899 Disorder of bone, unspecified: Secondary | ICD-10-CM

## 2013-06-12 DIAGNOSIS — C61 Malignant neoplasm of prostate: Secondary | ICD-10-CM

## 2013-06-12 DIAGNOSIS — T451X5A Adverse effect of antineoplastic and immunosuppressive drugs, initial encounter: Secondary | ICD-10-CM

## 2013-06-12 LAB — CBC WITH DIFFERENTIAL/PLATELET
BASO%: 1.1 % (ref 0.0–2.0)
Basophils Absolute: 0.1 10*3/uL (ref 0.0–0.1)
EOS%: 5 % (ref 0.0–7.0)
HCT: 35.4 % — ABNORMAL LOW (ref 38.4–49.9)
HGB: 11.1 g/dL — ABNORMAL LOW (ref 13.0–17.1)
LYMPH%: 30.2 % (ref 14.0–49.0)
MCH: 26.8 pg — ABNORMAL LOW (ref 27.2–33.4)
MCHC: 31.4 g/dL — ABNORMAL LOW (ref 32.0–36.0)
MCV: 85.5 fL (ref 79.3–98.0)
NEUT%: 53.9 % (ref 39.0–75.0)
Platelets: 185 10*3/uL (ref 140–400)
lymph#: 1.3 10*3/uL (ref 0.9–3.3)

## 2013-06-12 LAB — COMPREHENSIVE METABOLIC PANEL (CC13)
AST: 19 U/L (ref 5–34)
BUN: 15.9 mg/dL (ref 7.0–26.0)
CO2: 27 mEq/L (ref 22–29)
Calcium: 8.9 mg/dL (ref 8.4–10.4)
Chloride: 106 mEq/L (ref 98–109)
Creatinine: 0.8 mg/dL (ref 0.7–1.3)
Total Bilirubin: 1.1 mg/dL (ref 0.20–1.20)

## 2013-06-12 LAB — PSA: PSA: 226.2 ng/mL — ABNORMAL HIGH (ref <=4.00)

## 2013-06-12 NOTE — Progress Notes (Signed)
Hematology and Oncology Follow Up Visit  Aaron Schaefer [id number] September 21, 1933 79 y.o. 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 Initial diagnosis was in 2001. Gleason score is 3+4 = 7. PSA was 4.96.  Prior Therapy:  1. S/P prostateectomy followed by radiation therapy at the time of diagnosis. 2. Started on degarelix on 11/21/10 due to a rising PSA. This was switched to Lupron in March 2012 due to pain at the injection site. 3. He developed bone mets at T3, T4, & T5 and was started on Casodex and Xgeva on 12/22/11. 4. Received Provenge 02/15/12 through 03/14/12. 5. He received Xtandi from June 2013 to September 2013. 6. S/P Systemic chemotherapy with Taxotere started on 09/16/12. S/P 5 cycles. Treatment stopped in 12/2012 due to poor tolerance.   Current therapy:  Started Zytiga 1000 mg daily on 03/03/13. He remains on Lupron and Xgeva at [facility].  Interim History:  Aaron Schaefer is seen today for routine follow-up. He continues to do well Zytiga.The patient reports that he still has fatigue. He is still able to get out of the house and work, but tires easily. He still has lower extremity edema which is improving and he is able to wear shoes better. He is on Lasix and K supplement. Reports dry skin to his lower extremities and hands. Has nail changes due to prior Taxotere is improving. No fevers or chills noted. No SOB or dyspnea.  He also reports numbness and pain to his feet and fingertips that has improved. No urinary complaints. No hematuria. Appetite is improving his weight is getting better. No new complications due to Zytiga.   Medications: I have reviewed the patient's current medications.   Current Outpatient Prescriptions  Medication Sig Dispense Refill  . abiraterone Acetate (ZYTIGA) 250 MG tablet Take 4 tablets (1,000 mg total) by mouth daily. Take on an empty stomach 1 hour before  or 2 hours after a meal  120 tablet  0  . calcium carbonate (OS-CAL) 600 MG TABS Take 600 mg by mouth 2 (two) times daily with a meal.      . furosemide (LASIX) 20 MG tablet Take 1 tablet (20 mg total) by mouth daily.  30 tablet  2  . loperamide (IMODIUM A-D) 2 MG tablet Take 2 mg by mouth 4 (four) times daily as needed.      . ondansetron (ZOFRAN) 8 MG tablet Take 8 mg by mouth every 8 (eight) hours as needed.      . potassium chloride SA (K-DUR,KLOR-CON) 20 MEQ tablet Take 1 tablet (20 mEq total) by mouth daily.  30 tablet  1  . simvastatin (ZOCOR) 80 MG tablet Take 80 mg by mouth at bedtime.       No current facility-administered medications for this visit.    Allergies: No Known Allergies  Past Medical History, Surgical history, Social history, and Family History were reviewed and updated.  Review of Systems: Constitutional:  Negative for fever, chills, night sweats, anorexia, weight loss, pain. Cardiovascular: no chest pain or dyspnea on exertion Respiratory: no cough, shortness of breath, or wheezing Neurological: no TIA or stroke symptoms Dermatological: negative ENT: negative Skin: Negative. Gastrointestinal: no abdominal pain, change in bowel habits, or black or bloody stools Genito-Urinary: no dysuria, trouble voiding, or hematuria Hematological and Lymphatic: negative Breast: negative for breast lumps Musculoskeletal: negative Remaining ROS negative.  Physical Exam: Blood pressure 141/93, pulse 83, temperature 97.5 F (36.4  C), temperature source Oral, resp. rate 18, height 5\' 5"  (1.651 m), weight 175 lb 1.6 oz (79.425 kg). ECOG: 0-1 General appearance: alert, cooperative and no distress Head: Normocephalic, without obvious abnormality, atraumatic Neck: no adenopathy, no carotid bruit, no JVD, supple, symmetrical, trachea midline and thyroid not enlarged, symmetric, no tenderness/mass/nodules Lymph nodes: Cervical, supraclavicular, and axillary nodes  normal. Heart:regular rate and rhythm, S1, S2 normal, no murmur, click, rub or gallop Lung:chest clear, no wheezing, rales, normal symmetric air entry, no tachypnea, retractions or cyanosis Abdomen: soft, non-tender, without masses or organomegaly EXT:no erythema, induration, or nodules. Trace edema to the BLE.   Lab Results: Lab Results  Component Value Date   WBC 4.4 06/12/2013   HGB 11.1* 06/12/2013   HCT 35.4* 06/12/2013   MCV 85.5 06/12/2013   PLT 185 06/12/2013     Chemistry      Component Value Date/Time   NA 142 06/12/2013 0822   NA 140 12/15/2012 1353   K 4.1 06/12/2013 0822   K 3.9 12/15/2012 1353   CL 105 05/08/2013 0856   CL 104 12/15/2012 1353   CO2 27 06/12/2013 0822   CO2 26 12/15/2012 1353   BUN 15.9 06/12/2013 0822   BUN 17 12/15/2012 1353   CREATININE 0.8 06/12/2013 0822   CREATININE 0.87 12/15/2012 1353      Component Value Date/Time   CALCIUM 8.9 06/12/2013 0822   CALCIUM 8.3* 12/15/2012 1353   ALKPHOS 202* 06/12/2013 0822   ALKPHOS 102 12/15/2012 1353   AST 19 06/12/2013 0822   AST 18 12/15/2012 1353   ALT 8 06/12/2013 0822   ALT 9 12/15/2012 1353   BILITOT 1.10 06/12/2013 0822   BILITOT 1.2 12/15/2012 1353     Results for Lythgoe, Kashmere A (MRN [id number]) as of 06/12/2013 08:48  Ref. Range 12/31/2012 08:17 01/21/2013 08:58 02/25/2013 14:41 04/03/2013 08:10 05/08/2013 08:56  PSA Latest Range: <=4.00 ng/mL 15.28 (H) 22.80 (H) 88.54 (H) 79.28 (H) 123.20 (H)    Impression and Plan: This is a 79 year old gentleman with the following issues:  1. Castration resistant prostate cancer. He is S/P Taxotere for 5 cycles. Therapy stopped due to worsening fatigue, neuropathy, and edema. His symptoms are improving slowly. His PSA has dropped from 88 to 79 after one month of Zytiga, but up to 123.20 last month. I recommend that he continue Zytiga without dose modification. PSA is pending today.    2. Protein calorie malnutrition. improved  4. Bony disease. He remains on Xgeva monthly at [facility].  5.  Androgen deprivation. He remains on Lupron at [facility].  6. Anemia. Due to chemotherapy. No active bleeding. No transfusion is indicated. Hgb is improving.  7. LE edema. Improved with Lasix to 20 mg daily. Use moisturizer to skin 2-3 times per day.  8. Nail changes. Due to prior Taxotere. Improving as well.   9. Follow-up. In 4-5 weeks to check tolerance to Zytiga.      [place], [place] 7/3/201411:03 AM

## 2013-06-12 NOTE — Patient Instructions (Addendum)
Results for Aaron Schaefer, Aaron Schaefer (MRN [id number]) as of 06/12/2013 08:48  Ref. Range 12/31/2012 08:17 01/21/2013 08:58 02/25/2013 14:41 04/03/2013 08:10 05/08/2013 08:56  PSA Latest Range: <=4.00 ng/mL 15.28 (H) 22.80 (H) 88.54 (H) 79.28 (H) 123.20 (H)

## 2013-06-12 NOTE — Telephone Encounter (Signed)
gv pt appt schedule for August.  °

## 2013-07-01 ENCOUNTER — Other Ambulatory Visit: Payer: Self-pay | Admitting: *Deleted

## 2013-07-01 ENCOUNTER — Telehealth: Payer: Self-pay | Admitting: *Deleted

## 2013-07-01 DIAGNOSIS — C61 Malignant neoplasm of prostate: Secondary | ICD-10-CM

## 2013-07-01 MED ORDER — ABIRATERONE ACETATE 250 MG PO TABS: 1000.0000 mg | ORAL_TABLET | Freq: Every day | ORAL | Status: DC

## 2013-07-01 MED ORDER — HYDROCODONE-ACETAMINOPHEN 5-325 MG PO TABS: 1.0000 | ORAL_TABLET | Freq: Four times a day (QID) | ORAL | Status: DC | PRN

## 2013-07-01 NOTE — Telephone Encounter (Signed)
Wife louise calling to say patient having more pain in lower back, hips and thighs and across the shoulders. Aleve and aspirin not helping. He is lying in a recliner and  bed more than usual. Per dr Clelia Croft, vicodin ES called in to [address]. Wife notified. Take medication with food and make sure he is on a stool softener. Wife verbalizes understanding.

## 2013-07-01 NOTE — Telephone Encounter (Signed)
THIS REFILL REQUEST FOR ZYTIGA WAS PLACED IN DR.SHADAD'S ACTIVE WORK FOLDER. 

## 2013-07-04 ENCOUNTER — Telehealth: Payer: Self-pay | Admitting: *Deleted

## 2013-07-04 DIAGNOSIS — C61 Malignant neoplasm of prostate: Secondary | ICD-10-CM

## 2013-07-04 MED ORDER — ABIRATERONE ACETATE 250 MG PO TABS: 1000.0000 mg | ORAL_TABLET | Freq: Every day | ORAL | Status: DC

## 2013-07-04 NOTE — Telephone Encounter (Signed)
Script for zytiga faxed to [facility] [phone] 

## 2013-07-07 NOTE — Telephone Encounter (Signed)
RECEIVED A FAX FROM [facility] CONCERNING A CONFIRMATION OF PRESCRIPTION SHIPMENT FOR ZYTIGA ON 07/04/13.

## 2013-07-16 ENCOUNTER — Other Ambulatory Visit: Payer: Self-pay

## 2013-07-17 ENCOUNTER — Ambulatory Visit (HOSPITAL_BASED_OUTPATIENT_CLINIC_OR_DEPARTMENT_OTHER): Payer: Medicare Other | Admitting: Oncology

## 2013-07-17 ENCOUNTER — Telehealth: Payer: Self-pay | Admitting: Oncology

## 2013-07-17 ENCOUNTER — Other Ambulatory Visit (HOSPITAL_BASED_OUTPATIENT_CLINIC_OR_DEPARTMENT_OTHER): Payer: Medicare Other | Admitting: Lab

## 2013-07-17 VITALS — BP 137/91 | HR 97 | Temp 98.1°F | Resp 20 | Ht 65.0 in | Wt 169.9 lb

## 2013-07-17 DIAGNOSIS — D6481 Anemia due to antineoplastic chemotherapy: Secondary | ICD-10-CM

## 2013-07-17 DIAGNOSIS — C61 Malignant neoplasm of prostate: Secondary | ICD-10-CM

## 2013-07-17 DIAGNOSIS — M949 Disorder of cartilage, unspecified: Secondary | ICD-10-CM

## 2013-07-17 DIAGNOSIS — E46 Unspecified protein-calorie malnutrition: Secondary | ICD-10-CM

## 2013-07-17 LAB — CBC WITH DIFFERENTIAL/PLATELET
Basophils Absolute: 0.1 10*3/uL (ref 0.0–0.1)
EOS%: 4 % (ref 0.0–7.0)
HCT: 30.6 % — ABNORMAL LOW (ref 38.4–49.9)
HGB: 9.9 g/dL — ABNORMAL LOW (ref 13.0–17.1)
MCH: 26.4 pg — ABNORMAL LOW (ref 27.2–33.4)
MCV: 81.2 fL (ref 79.3–98.0)
MONO%: 10.5 % (ref 0.0–14.0)
NEUT%: 63.3 % (ref 39.0–75.0)

## 2013-07-17 LAB — COMPREHENSIVE METABOLIC PANEL (CC13)
AST: 27 U/L (ref 5–34)
Alkaline Phosphatase: 183 U/L — ABNORMAL HIGH (ref 40–150)
BUN: 12.4 mg/dL (ref 7.0–26.0)
Calcium: 8.8 mg/dL (ref 8.4–10.4)
Creatinine: 0.8 mg/dL (ref 0.7–1.3)

## 2013-07-17 MED ORDER — MEGESTROL ACETATE 40 MG PO TABS: 40.0000 mg | ORAL_TABLET | Freq: Two times a day (BID) | ORAL | Status: DC

## 2013-07-17 MED ORDER — MEGESTROL ACETATE 40 MG/ML PO SUSP: 200.0000 mg | Freq: Two times a day (BID) | ORAL | Status: DC

## 2013-07-17 NOTE — Progress Notes (Signed)
Hematology and Oncology Follow Up Visit  Aaron Schaefer [id number] Jun 11, 1933 79 y.o. 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 Initial diagnosis was in 2001. Gleason score is 3+4 = 7. PSA was 4.96.  Prior Therapy:  1. S/P prostateectomy followed by radiation therapy at the time of diagnosis. 2. Started on degarelix on 11/21/10 due to Schaefer rising PSA. This was switched to Lupron in March 2012 due to pain at the injection site. 3. He developed bone mets at T3, T4, & T5 and was started on Casodex and Xgeva on 12/22/11. 4. Received Provenge 02/15/12 through 03/14/12. 5. He received Xtandi from June 2013 to September 2013. 6. S/P Systemic chemotherapy with Taxotere started on 09/16/12. S/P 5 cycles. Treatment stopped in 12/2012 due to poor tolerance.   Current therapy:  Started Zytiga 1000 mg daily on 03/03/13. He remains on Lupron and Xgeva at [facility].  Interim History:  Aaron Schaefer is seen today for routine follow-up. He continues to do well Zytiga without major complications but he did miss 5 days.The patient reports more fatigue. He is still able to get out of the house and work, but tires easily. He still has lower extremity edema which is improving and he is able to wear shoes better. Reports dry skin to his lower extremities and hands. Has nail changes due to prior Taxotere is improving. No fevers or chills noted. No SOB or dyspnea. He is reporting more diffuse bone pain in his shoulders and hips. He is using hydrocodone with some help. He is loosing weight as well with poor po intake.   Medications: I have reviewed the patient's current medications.   Current Outpatient Prescriptions  Medication Sig Dispense Refill  . abiraterone Acetate (ZYTIGA) 250 MG tablet Take 4 tablets (1,000 mg total) by mouth daily. Take on an empty stomach 1 hour before or 2 hours after Schaefer meal  120 tablet  0  .  calcium carbonate (OS-CAL) 600 MG TABS Take 600 mg by mouth 2 (two) times daily with Schaefer meal.      . furosemide (LASIX) 20 MG tablet Take 1 tablet (20 mg total) by mouth daily.  30 tablet  2  . HYDROcodone-acetaminophen (NORCO/VICODIN) 5-325 MG per tablet Take 1 tablet by mouth every 6 (six) hours as needed for pain.  30 tablet  0  . loperamide (IMODIUM Schaefer-D) 2 MG tablet Take 2 mg by mouth 4 (four) times daily as needed.      . megestrol (MEGACE ORAL) 40 MG/ML suspension Take 5 mLs (200 mg total) by mouth 2 (two) times daily.  240 mL  0  . ondansetron (ZOFRAN) 8 MG tablet Take 8 mg by mouth every 8 (eight) hours as needed.      . potassium chloride SA (K-DUR,KLOR-CON) 20 MEQ tablet Take 1 tablet (20 mEq total) by mouth daily.  30 tablet  1  . simvastatin (ZOCOR) 80 MG tablet Take 80 mg by mouth at bedtime.       No current facility-administered medications for this visit.    Allergies: No Known Allergies  Past Medical History, Surgical history, Social history, and Family History were reviewed and updated.  Review of Systems: Constitutional:  Negative for fever, chills, night sweats, anorexia, weight loss, pain. Cardiovascular: no chest pain or dyspnea on exertion Respiratory: no cough, shortness of breath, or wheezing Neurological: no TIA or stroke symptoms Dermatological: negative ENT: negative Skin: Negative. Gastrointestinal:  no abdominal pain, change in bowel habits, or black or bloody stools Genito-Urinary: no dysuria, trouble voiding, or hematuria Hematological and Lymphatic: negative Breast: negative for breast lumps Musculoskeletal: negative Remaining ROS negative.  Physical Exam: Blood pressure 137/91, pulse 97, temperature 98.1 F (36.7 C), temperature source Oral, resp. rate 20, height 5\' 5"  (1.651 m), weight 169 lb 14.4 oz (77.066 kg). ECOG: 1-2 General appearance: alert, cooperative and no distress Head: Normocephalic, without obvious abnormality, atraumatic Neck: no  adenopathy, no carotid bruit, no JVD, supple, symmetrical, trachea midline and thyroid not enlarged, symmetric, no tenderness/mass/nodules Lymph nodes: Cervical, supraclavicular, and axillary nodes normal. Heart:regular rate and rhythm, S1, S2 normal, no murmur, click, rub or gallop Lung:chest clear, no wheezing, rales, normal symmetric air entry, no tachypnea, retractions or cyanosis Abdomen: soft, non-tender, without masses or organomegaly EXT:no erythema, induration, or nodules. Trace edema to the BLE. Neuro: no deficits.   Lab Results: Lab Results  Component Value Date   WBC 4.9 07/17/2013   HGB 9.9* 07/17/2013   HCT 30.6* 07/17/2013   MCV 81.2 07/17/2013   PLT 242 07/17/2013     Chemistry      Component Value Date/Time   NA 141 07/17/2013 0822   NA 140 12/15/2012 1353   K 4.0 07/17/2013 0822   K 3.9 12/15/2012 1353   CL 105 05/08/2013 0856   CL 104 12/15/2012 1353   CO2 27 07/17/2013 0822   CO2 26 12/15/2012 1353   BUN 12.4 07/17/2013 0822   BUN 17 12/15/2012 1353   CREATININE 0.8 07/17/2013 0822   CREATININE 0.87 12/15/2012 1353      Component Value Date/Time   CALCIUM 8.8 07/17/2013 0822   CALCIUM 8.3* 12/15/2012 1353   ALKPHOS 183* 07/17/2013 0822   ALKPHOS 102 12/15/2012 1353   AST 27 07/17/2013 0822   AST 18 12/15/2012 1353   ALT 8 07/17/2013 0822   ALT 9 12/15/2012 1353   BILITOT 1.00 07/17/2013 0822   BILITOT 1.2 12/15/2012 1353     Results for Aaron Schaefer (MRN [id number]) as of 07/17/2013 08:54  Ref. Range 05/08/2013 08:56 06/12/2013 08:22  PSA Latest Range: <=4.00 ng/mL 123.20 (H) 226.20 (H)     Impression and Plan: This is Schaefer 79 year old gentleman with the following issues:  1. Castration resistant prostate cancer. He is S/P Taxotere for 5 cycles. Therapy stopped due to worsening fatigue, neuropathy, and edema. His symptoms are improving slowly. His PSA has dropped from 88 to 79 after one month of Zytiga, but up to 226 last month. I recommend that he continue Zytiga but I will stage him with Schaefer CT scan  and bone scan. It is very likely he has progresses and that is why he is clinically declining.  If his disease is mainly in the bone we can consider Xofigo. He is not Schaefer candidate for more chemotherapy.     2. Protein calorie malnutrition. I gave him Rx for megace.   4. Bony disease. He remains on Xgeva monthly at [facility].  5. Androgen deprivation. He remains on Lupron at [facility].  6. Anemia. Due to chemotherapy. No active bleeding. No transfusion is indicated.   7. LE edema. Improved with Lasix to 20 mg daily. Use moisturizer to skin 2-3 times per day.  8. Bone pain: He is using hydrocodone for now. We will evaluate his bone disease and possibly consider radiation therapy for sever pain.    9. Follow-up. In 4-5 weeks.  Nisa Decaire 8/7/20149:14 AM

## 2013-07-17 NOTE — Telephone Encounter (Signed)
gv adn printed appt sched and avs for pt...gv pt barium   °

## 2013-07-21 ENCOUNTER — Telehealth: Payer: Self-pay | Admitting: *Deleted

## 2013-07-21 NOTE — Telephone Encounter (Signed)
Wife calling to say patient has not had a BM in 5 days, is in pain, lies in bed and sleeps a lot. Per krisitn curcio np, they may use miralax daily and mag citrate today, drink 1/2 bottle wait 4 hours if no results, drink the other 1/2. Wife louise wrote down instructions and verbalized understanding.

## 2013-07-23 ENCOUNTER — Other Ambulatory Visit: Payer: Self-pay | Admitting: Oncology

## 2013-07-23 NOTE — Telephone Encounter (Signed)
Patient calling to report no BM after taking miralax and mag citrate yesterday, per kristin curcio np, use fleets enema, if no results  Use dulcolax suppository, if no results call us back.

## 2013-07-24 ENCOUNTER — Encounter (HOSPITAL_COMMUNITY)
Admission: RE | Admit: 2013-07-24 | Discharge: 2013-07-24 | Disposition: A | Discharge status: Home / Self Care | Payer: Medicare Other | Source: Ambulatory Visit | Attending: Oncology | Admitting: Oncology

## 2013-07-24 ENCOUNTER — Ambulatory Visit (HOSPITAL_COMMUNITY)
Admission: RE | Admit: 2013-07-24 | Discharge: 2013-07-24 | Disposition: A | Discharge status: Home / Self Care | Payer: Medicare Other | Source: Ambulatory Visit | Attending: Oncology | Admitting: Oncology

## 2013-07-24 ENCOUNTER — Telehealth: Payer: Self-pay | Admitting: *Deleted

## 2013-07-24 ENCOUNTER — Other Ambulatory Visit: Payer: Self-pay | Admitting: Oncology

## 2013-07-24 DIAGNOSIS — I714 Abdominal aortic aneurysm, without rupture, unspecified: Secondary | ICD-10-CM | POA: Insufficient documentation

## 2013-07-24 DIAGNOSIS — E278 Other specified disorders of adrenal gland: Secondary | ICD-10-CM | POA: Insufficient documentation

## 2013-07-24 DIAGNOSIS — K5989 Other specified functional intestinal disorders: Secondary | ICD-10-CM | POA: Insufficient documentation

## 2013-07-24 DIAGNOSIS — Z9079 Acquired absence of other genital organ(s): Secondary | ICD-10-CM | POA: Insufficient documentation

## 2013-07-24 DIAGNOSIS — C61 Malignant neoplasm of prostate: Secondary | ICD-10-CM | POA: Insufficient documentation

## 2013-07-24 DIAGNOSIS — C7951 Secondary malignant neoplasm of bone: Secondary | ICD-10-CM | POA: Insufficient documentation

## 2013-07-24 MED ORDER — TECHNETIUM TC 99M MEDRONATE IV KIT
25.0000 | PACK | Freq: Once | INTRAVENOUS | Status: AC | PRN
  Administered 2013-07-24: 25 via INTRAVENOUS

## 2013-07-24 MED ORDER — IOHEXOL 300 MG/ML  SOLN
100.0000 mL | Freq: Once | INTRAMUSCULAR | Status: AC | PRN
  Administered 2013-07-24: 100 mL via INTRAVENOUS

## 2013-07-24 NOTE — Telephone Encounter (Signed)
Wife brought in patient's medications. Med list given to her with all required meds highlighted, and a pill dispenser that she may fill weekly to make sure he is taking them properly. They also would like an earlier appt with dr Clelia Croft to discuss scans done today and to have their son, who lives in [place] to be present. Wife feels as is patient has given up, sleeps all of the time and is depressed. Note to dr Alver Fisher desk.

## 2013-07-24 NOTE — Telephone Encounter (Signed)
Spoke with wife, louise, new appt for patient is 07/29/13 @ 3:00. no labs

## 2013-07-24 NOTE — Telephone Encounter (Signed)
Wife calling to say patient did have a good BM after second fleets enema. But patient seems confused, has not been taking his medications, properly. off balance and c/o pain in groin area. Lips chapped, whitesh. Instructed wife to give him potassium 20 meq now, with a few sips of water. he is on lasix. They will be here today for a pet scan and wife will bring his medication list to go over and see what he needs to be taking and she will administer them as directed. Note to dr Alver Fisher desk.

## 2013-07-25 ENCOUNTER — Telehealth: Payer: Self-pay | Admitting: Oncology

## 2013-07-25 NOTE — Telephone Encounter (Signed)
s.w. pt and advised on 8.19.14 appt...ok and aware

## 2013-07-29 ENCOUNTER — Ambulatory Visit (HOSPITAL_BASED_OUTPATIENT_CLINIC_OR_DEPARTMENT_OTHER): Payer: Medicare Other | Admitting: Oncology

## 2013-07-29 ENCOUNTER — Telehealth: Payer: Self-pay | Admitting: Oncology

## 2013-07-29 VITALS — BP 117/78 | HR 112 | Temp 98.1°F | Resp 18 | Ht 65.0 in | Wt 163.9 lb

## 2013-07-29 DIAGNOSIS — R609 Edema, unspecified: Secondary | ICD-10-CM

## 2013-07-29 DIAGNOSIS — G609 Hereditary and idiopathic neuropathy, unspecified: Secondary | ICD-10-CM

## 2013-07-29 DIAGNOSIS — C61 Malignant neoplasm of prostate: Secondary | ICD-10-CM

## 2013-07-29 DIAGNOSIS — D630 Anemia in neoplastic disease: Secondary | ICD-10-CM

## 2013-07-29 DIAGNOSIS — E46 Unspecified protein-calorie malnutrition: Secondary | ICD-10-CM

## 2013-07-29 DIAGNOSIS — C7951 Secondary malignant neoplasm of bone: Secondary | ICD-10-CM

## 2013-07-29 NOTE — Progress Notes (Signed)
Hematology and Oncology Follow Up Visit  Aaron Schaefer [id number] 05/26/1933 79 y.o. 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 Initial diagnosis was in 2001. Gleason score is 3+4 = 7. PSA was 4.96.  Prior Therapy:  1. S/P prostateectomy followed by radiation therapy at the time of diagnosis. 2. Started on degarelix on 11/21/10 due to a rising PSA. This was switched to Lupron in March 2012 due to pain at the injection site. 3. He developed bone mets at T3, T4, & T5 and was started on Casodex and Xgeva on 12/22/11. 4. Received Provenge 02/15/12 through 03/14/12. 5. He received Xtandi from June 2013 to September 2013. 6. S/P Systemic chemotherapy with Taxotere started on 09/16/12. S/P 5 cycles. Treatment stopped in 12/2012 due to poor tolerance.   Current therapy:  Started Zytiga 1000 mg daily on 03/03/13. He remains on Lupron and Xgeva at [facility].  Interim History:  Aaron Schaefer is seen today for routine follow-up. He continues on Zytiga  With major complications but he did miss 5 days.The patient reports more fatigue. He is still able to get out of the house and work, but tires easily. He still has lower extremity edema which is improving and he is able to wear shoes better.  No SOB or dyspnea. He is reporting more diffuse bone pain in his shoulders and hips. He is using hydrocodone with some help. He is loosing weight as well with poor po intake. Since his last visit he is reporting more and more fatigued more diffuse pain in the upper and lower body. He does not have any localizing symptoms or any neurological deficits.  Medications: I have reviewed the patient's current medications.   Current Outpatient Prescriptions  Medication Sig Dispense Refill  . abiraterone Acetate (ZYTIGA) 250 MG tablet Take 4 tablets (1,000 mg total) by mouth daily. Take on an empty stomach 1 hour before or 2  hours after a meal  120 tablet  0  . calcium carbonate (OS-CAL) 600 MG TABS Take 600 mg by mouth 2 (two) times daily with a meal.      . furosemide (LASIX) 20 MG tablet Take 1 tablet (20 mg total) by mouth daily.  30 tablet  2  . HYDROcodone-acetaminophen (NORCO/VICODIN) 5-325 MG per tablet Take 1 tablet by mouth every 6 (six) hours as needed for pain.  30 tablet  0  . loperamide (IMODIUM A-D) 2 MG tablet Take 2 mg by mouth 4 (four) times daily as needed.      . megestrol (MEGACE ORAL) 40 MG/ML suspension Take 5 mLs (200 mg total) by mouth 2 (two) times daily.  240 mL  0  . ondansetron (ZOFRAN) 8 MG tablet Take 8 mg by mouth every 8 (eight) hours as needed.      . potassium chloride SA (K-DUR,KLOR-CON) 20 MEQ tablet Take 1 tablet (20 mEq total) by mouth daily.  30 tablet  1  . simvastatin (ZOCOR) 80 MG tablet Take 80 mg by mouth at bedtime.       No current facility-administered medications for this visit.    Allergies: No Known Allergies  Past Medical History, Surgical history, Social history, and Family History were reviewed and updated.  Review of Systems: Constitutional:  Negative for fever, chills, night sweats, anorexia, weight loss, pain. Cardiovascular: no chest pain or dyspnea on exertion Respiratory: no cough, shortness of breath, or wheezing Neurological: no TIA or stroke symptoms  Dermatological: negative ENT: negative Skin: Negative. Gastrointestinal: no abdominal pain, change in bowel habits, or black or bloody stools Genito-Urinary: no dysuria, trouble voiding, or hematuria Hematological and Lymphatic: negative Breast: negative for breast lumps Musculoskeletal: negative Remaining ROS negative.  Physical Exam: Blood pressure 117/78, pulse 112, temperature 98.1 F (36.7 C), temperature source Oral, resp. rate 18, height 5\' 5"  (1.651 m), weight 163 lb 14.4 oz (74.345 kg). ECOG: 1  General appearance: alert, cooperative and no distress Head: Normocephalic, without  obvious abnormality, atraumatic Neck: no adenopathy, no carotid bruit, no JVD, supple, symmetrical, trachea midline and thyroid not enlarged, symmetric, no tenderness/mass/nodules Lymph nodes: Cervical, supraclavicular, and axillary nodes normal. Heart:regular rate and rhythm, S1, S2 normal, no murmur, click, rub or gallop Lung:chest clear, no wheezing, rales, normal symmetric air entry, no tachypnea, retractions or cyanosis Abdomen: soft, non-tender, without masses or organomegaly EXT:no erythema, induration, or nodules. Trace edema to the BLE. Neuro: no deficits.   Lab Results: Lab Results  Component Value Date   WBC 4.9 07/17/2013   HGB 9.9* 07/17/2013   HCT 30.6* 07/17/2013   MCV 81.2 07/17/2013   PLT 242 07/17/2013     Chemistry      Component Value Date/Time   NA 141 07/17/2013 0822   NA 140 12/15/2012 1353   K 4.0 07/17/2013 0822   K 3.9 12/15/2012 1353   CL 105 05/08/2013 0856   CL 104 12/15/2012 1353   CO2 27 07/17/2013 0822   CO2 26 12/15/2012 1353   BUN 12.4 07/17/2013 0822   BUN 17 12/15/2012 1353   CREATININE 0.8 07/17/2013 0822   CREATININE 0.87 12/15/2012 1353      Component Value Date/Time   CALCIUM 8.8 07/17/2013 0822   CALCIUM 8.3* 12/15/2012 1353   ALKPHOS 183* 07/17/2013 0822   ALKPHOS 102 12/15/2012 1353   AST 27 07/17/2013 0822   AST 18 12/15/2012 1353   ALT 8 07/17/2013 0822   ALT 9 12/15/2012 1353   BILITOT 1.00 07/17/2013 0822   BILITOT 1.2 12/15/2012 1353     CT CHEST, ABDOMEN AND PELVIS WITH CONTRAST  Technique: Multidetector CT imaging of the chest, abdomen and  pelvis was performed following the standard protocol during bolus  administration of intravenous contrast.  Contrast: OMNIPAQUE IOHEXOL 300 MG/ML SOLN  Comparison: CT 12/15/2012  CT CHEST  Findings: No axillary or supraclavicular lymphadenopathy. No  mediastinal or hilar lymphadenopathy. No pericardial fluid.  Review of the lung windows demonstrates no suspicious pulmonary  nodules.  IMPRESSION:  No evidence of  thoracic metastasis in the soft tissues.  CT ABDOMEN AND PELVIS  Findings: No focal hepatic lesion. Gallbladder is absent. The  pancreas and spleen are normal. There is a rounded enlargement of  the left adrenal gland to 22 mm. There is mild thickening of the  right adrenal gland to 11 mm. Nonenhancing cyst extend from the  left and right kidney.  Stomach is normal. There is some stasis of enteric contents in the  distal ileum leading up to the terminal ileum. No obstructing  lesions identified. Stool in the right colon. There is no  obstructing mass lesion identified in the colon. The rectosigmoid  colon is collapsed.  Aorta demonstrates a saccular aneurysm to 43 mm. No  retroperitoneal periportal lymphadenopathy. Mesenteric disease.  No free fluid the pelvis. Post prostatectomy. No pelvic  lymphadenopathy.  View of the bone windows demonstrates extensive round sclerotic  lesions throughout the pelvis, spine, sternum, and femurs, and  humeri. There  is interval advancement of the sclerotic metastasis  compared to CT of the chest of 12/15/2012. Particulate there are  more sclerotic lesions within the sternum enlarging lesions in the  thoracic spine.  IMPRESSION:  1. Nodular enlargement of the left adrenal gland is highly  concerning for adrenal gland metastasis.  2. Interval advancement of widespread osseous metastasis present  on prior exam.  3. Infrarenal abdominal aortic aneurysm to 43 mm.  4. Stasis of the distal enteric contents without obstructing  lesion identified  Original Report Authenticated By: Genevive Bi, M.D.   NUCLEAR MEDICINE WHOLE BODY BONE SCINTIGRAPHY  Technique: Whole body anterior and posterior images were obtained  approximately 3 hours after intravenous injection of  radiopharmaceutical.  Radiopharmaceutical: CURIE TC-MDP TECHNETIUM TC 19M  MEDRONATE IV KIT  Comparison: 12/22/2011.  Findings: Interval development of diffuse osseous metastatic   disease involving the axial and appendicular skeleton. Numerous  lesions involving the shoulders, ribs, thoracic and lumbar  vertebral bodies, pelvis, both hips, both femora and both tibias.  IMPRESSION:  Interval development of diffuse osseous metastatic disease.      Impression and Plan: This is a 79 year old gentleman with the following issues:  1. Castration resistant prostate cancer. He is S/P Taxotere for 5 cycles. Therapy stopped due to worsening fatigue, neuropathy, and edema. His PSA has dropped from 88 to 79 after one month of Zytiga, but in recent months his PSA had been rising most recently up to 298.4 on 07/17/2013. CT scan as well as bone scan results discussed today with the patient and family and treatment options were discussed today in detail. These would include Jevtana chemotherapy which I think he is a poor candidate for, Xofigo or supportive care only. I explained that regardless of the treatment option, his disease is incurable and likely will require hospice care at some point. The risks and benefits of all these approaches were discussed at this point he is interested in proceeding with Xofigo. Given the fact that his disease is predominantly in the bone at a low be a reasonable option.  2. Protein calorie malnutrition. Still him much improvement with Megace will continue to monitor that.  4. Bony disease. He remains on Xgeva monthly at [facility].  5. Androgen deprivation. He remains on Lupron at [facility].  6. Anemia. Due to chemotherapy. No active bleeding. No transfusion is indicated.   7. LE edema. Improved with Lasix to 20 mg daily. Use moisturizer to skin 2-3 times per day.  8. Bone pain: He is using hydrocodone for now.   9. Follow-up. In 4 weeks.      Koraima Albertsen 8/19/20144:18 PM

## 2013-07-29 NOTE — Telephone Encounter (Signed)
Mrs Clydie Braun sched pt to see Dr Kathrynn Running 9.3.14 @ 1:00pm

## 2013-07-29 NOTE — Telephone Encounter (Signed)
gv and printed appt sched and avs for pt.Marland KitchenMarland KitchenMarland KitchenClydie Schaefer from radiation to call me to sched appt...pt ok and aware

## 2013-07-30 ENCOUNTER — Telehealth: Payer: Self-pay | Admitting: *Deleted

## 2013-07-30 ENCOUNTER — Encounter: Payer: Self-pay | Admitting: *Deleted

## 2013-07-30 NOTE — Progress Notes (Signed)
Faxed note to [facility], that patient is no longer on zytiga.

## 2013-07-30 NOTE — Telephone Encounter (Signed)
[facility] faxed Zytiga refill request.  Request to provider for review.    Now note that patient is no longer on Zytiga per collaborative phone note.

## 2013-07-31 ENCOUNTER — Other Ambulatory Visit: Payer: Self-pay | Admitting: *Deleted

## 2013-07-31 MED ORDER — HYDROXYZINE HCL 25 MG PO TABS: 25.0000 mg | ORAL_TABLET | Freq: Four times a day (QID) | ORAL | Status: DC | PRN

## 2013-07-31 NOTE — Telephone Encounter (Signed)
Patient calling. He forgot to tell dr Clelia Croft about  itching since his last scan, with contrast. Has red rash on trunk and around hips, denies burning or pain. Per dr Clelia Croft, atarax e-scribed to [facility] [address]. Patient notified.

## 2013-08-12 ENCOUNTER — Encounter: Payer: Self-pay | Admitting: Radiation Oncology

## 2013-08-12 DIAGNOSIS — C7951 Secondary malignant neoplasm of bone: Secondary | ICD-10-CM | POA: Insufficient documentation

## 2013-08-12 NOTE — Progress Notes (Signed)
Histology and Location of Primary Cancer: adenocarcinoma of the prostate  Sites of Visceral and Bony Metastatic Disease:   Interval development of diffuse osseous metastatic  disease involving the axial and appendicular skeleton. Numerous  lesions involving the shoulders, ribs, thoracic and lumbar  vertebral bodies, pelvis, both hips, both femora and both tibias.   Location(s) of Symptomatic Metastases: shoulders and hips  Past/Anticipated chemotherapy by medical oncology, if any:   Prior Therapy:  1. S/P prostateectomy followed by radiation therapy at the time of diagnosis.  2. Started on degarelix on 11/21/10 due to a rising PSA. This was switched to Lupron in March 2012 due to pain at the injection site.  3. He developed bone mets at T3, T4, & T5 and was started on Casodex and Xgeva on 12/22/11.  4. Received Provenge 02/15/12 through 03/14/12.  5. He received Xtandi from June 2013 to September 2013.  6. S/P Systemic chemotherapy with Taxotere started on 09/16/12. S/P 5 cycles. Treatment stopped in 12/2012 due to poor tolerance.  Current therapy: Started Zytiga 1000 mg daily on 03/03/13. He remains on Lupron and Xgeva at [facility]   Pain on a scale of 0-10 is: reports diffuse bone pain in shoulders and hips; using hydrocodone with some help   If Spine Met(s), symptoms, if any, include:  Bowel/Bladder retention or incontinence (please describe): no change in bowel habits, no dysuria, no trouble voiding, no hematuria  Numbness or weakness in extremities (please describe): None noted  Current Decadron regimen, if applicable: Not prescribed at this time  Ambulatory status? Walker? Wheelchair?: ambulatory  SAFETY ISSUES:  Prior radiation?   05/10/07-07/03/07 45 Gy in 25 fractions to prostatic fossa plus a 1-2 cm global margin plus three dimensional  boost to prostatic fossa plus a 5-10 margin to a final dose of 68.4 Gy.  Pacemaker/ICD? NO  Possible current pregnancy? N/A  Is  the patient on methotrexate? N/A  Current Complaints / other details:  79 year old male. 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  1. Interested in proceeding with Xofigo 2. Taking megace for protein calorie malnutrition. 3. Remains on Xgeva for bony disease 4. Remains on Lupron for androgen deprivation 5. Remains on Lasix 20 mg daily for LE edema. 6. Continues to take hydrocodone for pain

## 2013-08-13 ENCOUNTER — Encounter: Payer: Self-pay | Admitting: Radiation Oncology

## 2013-08-13 ENCOUNTER — Ambulatory Visit
Admission: RE | Admit: 2013-08-13 | Discharge: 2013-08-13 | Disposition: A | Discharge status: Home / Self Care | Payer: Medicare Other | Source: Ambulatory Visit | Attending: Radiation Oncology | Admitting: Radiation Oncology

## 2013-08-13 VITALS — BP 116/75 | HR 105 | Temp 97.6°F | Resp 16 | Ht 66.0 in | Wt 153.6 lb

## 2013-08-13 DIAGNOSIS — C7951 Secondary malignant neoplasm of bone: Secondary | ICD-10-CM | POA: Insufficient documentation

## 2013-08-13 DIAGNOSIS — C61 Malignant neoplasm of prostate: Secondary | ICD-10-CM | POA: Insufficient documentation

## 2013-08-13 DIAGNOSIS — Z79899 Other long term (current) drug therapy: Secondary | ICD-10-CM | POA: Insufficient documentation

## 2013-08-13 DIAGNOSIS — Z923 Personal history of irradiation: Secondary | ICD-10-CM | POA: Insufficient documentation

## 2013-08-13 NOTE — Progress Notes (Signed)
  Radiation Oncology         [phone] ________________________________  Name: Aaron Schaefer MRN: [id number]  Date: 08/13/2013  DOB: 03-16-1933  Xofigo Treatment Planning Note:  Diagnosis:  Castration resistant prostate cancer with painful bone involvement  Narrative: [name] is a patient who has been diagnosed with castration resistant prostate cancer with painful bone involvement.  His most recent blood counts show that he remains a good candidate to proceed with Ra-223.  The patient is going to receive Xofigo for his treatment.   Radiation Treatment Planning:  The prescribed radiation activity will be 50 kBq per kg per infusions. The plan is to offer a total of 6 IV administrations of this agent, assuming the blood counts are adequate prior to each administration, with each infusion done at 4 week intervals.  This will be done as an IV administration in the nuclear medicine department, with care to undertake all radiation protection precautions as recommended.   ________________________________  Artist Pais. Kathrynn Running, M.D.

## 2013-08-13 NOTE — Addendum Note (Signed)
Encounter addended by: Agnes Lawrence, RN on: 08/13/2013  4:09 PM<BR>     Documentation filed: Inpatient Patient Education, Inpatient Document Flowsheet, Charges VN

## 2013-08-13 NOTE — Progress Notes (Signed)
See progress note under physician encounter. 

## 2013-08-13 NOTE — Progress Notes (Signed)
Reverend [employer] and his wife are here today to discuss role of radiation therapy in the treatment of metastatic bone pain to shoulders and hips. Patient denies any bone pain today. Patient goes on to explain he hasn't felt any pain in over two weeks. However, the pain he once felt could get as high as a 9 on a scale of 0-10. Patient reports that he infrequently takes hydrocodone for said pain holding out until it is unbearable. Patient reports just sitting still hurt when the pain was at its worst. Patient reports the fell yesterday onto his left. He reports he tripped over a garden hose left out by his wife. Neuropathy of the sole of his feet and finger tips reported. Denies nausea, vomiting, headache, dizziness, night sweats or diplopia. Denies taking decadron. Reports drinking two ensures per day, taking megace as directed, and maintained his weight.

## 2013-08-13 NOTE — Progress Notes (Signed)
Radiation Oncology         [phone] ________________________________  Initial outpatient Consultation  Name: Aaron Schaefer MRN: [id number]  Date: 08/13/2013  DOB: [birth year]  WU:[id number],NWGNF [name], MD  Benjiman Core, MD   REFERRING PHYSICIAN: Benjiman Core, MD  DIAGNOSIS: 79 year old with castration resistant prostate cancer diagnosed in 2001 with rising PSA up to 298.4  HISTORY OF PRESENT ILLNESS::Aaron Schaefer is a 79 y.o. male who underwent prostateectomy followed by radiation therapy in 2001.  He was started on degarelix on 11/21/10 due to a rising PSA. This was switched to Lupron in March 2012 due to pain at the injection site. He developed bone mets at T3, T4, & T5 and was started on Casodex and Xgeva on 12/22/11.  He received Provenge 02/15/12 through 03/14/12. He received Xtandi from June 2013 to September 2013.   He underwent systemic chemotherapy with Taxotere started on 09/16/12. S/P 5 cycles. That treatment stopped in 12/2012 due to poor tolerance.  Most recently he was started Zytiga 1000 mg daily on 03/03/13. He remains on Lupron and Xgeva at [facility].  PSA has been steadily rising since 04/03/13.    Recent bone scan 8/14 shows new diffuse osseus metastatic disease.     He has kindly been referred today for consideration of Xofigo.  PREVIOUS RADIATION THERAPY: Yes prostate fossa radiotherapy.  PAST MEDICAL HISTORY:  has a past medical history of Allergy; Hyperlipidemia; Prostate cancer; S/P radiation therapy; S/P chemotherapy, time since greater than 12 weeks; Asthma; and Bone metastasis (2013).    PAST SURGICAL HISTORY: Past Surgical History  Procedure Laterality Date  . Cholecystectomy    . Cataract extraction    . Prostate biopsy  2001  . Perineal prostatectomy  2001    FAMILY HISTORY: family history is negative for Prostate cancer and Cancer.  SOCIAL HISTORY:  reports that he has been smoking Cigarettes.  He has a 20 pack-year smoking history. He  has never used smokeless tobacco. He reports that he drinks about 0.5 ounces of alcohol per week. He reports that he does not use illicit drugs.  ALLERGIES: Review of patient's allergies indicates no known allergies.  MEDICATIONS:  Current Outpatient Prescriptions  Medication Sig Dispense Refill  . calcium carbonate (OS-CAL) 600 MG TABS Take 600 mg by mouth 2 (two) times daily with a meal.      . HYDROcodone-acetaminophen (NORCO/VICODIN) 5-325 MG per tablet Take 1 tablet by mouth every 6 (six) hours as needed for pain.  30 tablet  0  . megestrol (MEGACE ORAL) 40 MG/ML suspension Take 5 mLs (200 mg total) by mouth 2 (two) times daily.  240 mL  0  . psyllium (METAMUCIL) 58.6 % packet Take 1 packet by mouth daily.      . simvastatin (ZOCOR) 80 MG tablet Take 80 mg by mouth at bedtime.      . furosemide (LASIX) 20 MG tablet Take 1 tablet (20 mg total) by mouth daily.  30 tablet  2  . hydrOXYzine (ATARAX/VISTARIL) 25 MG tablet Take 1 tablet (25 mg total) by mouth every 6 (six) hours as needed for itching.  30 tablet  0  . loperamide (IMODIUM A-D) 2 MG tablet Take 2 mg by mouth 4 (four) times daily as needed.      . ondansetron (ZOFRAN) 8 MG tablet Take 8 mg by mouth every 8 (eight) hours as needed.      . potassium chloride SA (K-DUR,KLOR-CON) 20 MEQ tablet Take 1  tablet (20 mEq total) by mouth daily.  30 tablet  1   No current facility-administered medications for this encounter.    REVIEW OF SYSTEMS:  A 15 point review of systems is documented in the electronic medical record. This was obtained by the nursing staff. However, I reviewed this with the patient to discuss relevant findings and make appropriate changes.  A comprehensive review of systems was negative.  He has minimal bone pain   PHYSICAL EXAM:  height is 5\' 6"  (1.676 m) and weight is 153 lb 9.6 oz (69.673 kg). His oral temperature is 97.6 F (36.4 C). His blood pressure is 116/75 and his pulse is 105. His respiration is 16 and oxygen  saturation is 100%.   Per med-onc  General appearance: alert, cooperative and no distress  Head: Normocephalic, without obvious abnormality, atraumatic  Neck: no adenopathy, no carotid bruit, no JVD, supple, symmetrical, trachea midline and thyroid not enlarged, symmetric, no tenderness/mass/nodules  Lymph nodes: Cervical, supraclavicular, and axillary nodes normal.  Heart:regular rate and rhythm, S1, S2 normal, no murmur, click, rub or gallop  Lung:chest clear, no wheezing, rales, normal symmetric air entry, no tachypnea, retractions or cyanosis  Abdomen: soft, non-tender, without masses or organomegaly  EXT:no erythema, induration, or nodules. Trace edema to the BLE.  Neuro: no deficits.   KPS = 90  LABORATORY DATA:  Lab Results  Component Value Date   WBC 4.9 07/17/2013   HGB 9.9* 07/17/2013   HCT 30.6* 07/17/2013   MCV 81.2 07/17/2013   PLT 242 07/17/2013   Lab Results  Component Value Date   NA 141 07/17/2013   K 4.0 07/17/2013   CL 105 05/08/2013   CO2 27 07/17/2013   Lab Results  Component Value Date   ALT 8 07/17/2013   AST 27 07/17/2013   ALKPHOS 183* 07/17/2013   BILITOT 1.00 07/17/2013     RADIOGRAPHY: Ct Chest W Contrast  07/24/2013   *RADIOLOGY REPORT*  Clinical Data:  Prostate cancer  CT CHEST, ABDOMEN AND PELVIS WITH CONTRAST  Technique:  Multidetector CT imaging of the chest, abdomen and pelvis was performed following the standard protocol during bolus administration of intravenous contrast.  Contrast: OMNIPAQUE IOHEXOL 300 MG/ML  SOLN  Comparison:  CT 12/15/2012  CT CHEST  Findings:  No axillary or supraclavicular lymphadenopathy.  No mediastinal or hilar lymphadenopathy.  No pericardial fluid.  Review of the lung windows demonstrates no suspicious pulmonary nodules.  IMPRESSION: No evidence of thoracic metastasis in the soft tissues.  CT ABDOMEN AND PELVIS  Findings:  No focal hepatic lesion.  Gallbladder is absent.  The pancreas and spleen are normal.  There is a rounded  enlargement of the left adrenal gland to 22 mm.  There is mild thickening of the right adrenal gland to 11 mm. Nonenhancing cyst extend from the left and right kidney.  Stomach is normal.  There is some stasis of enteric contents in the distal ileum leading up to the terminal ileum.  No obstructing lesions identified.  Stool in the right colon.  There is no obstructing mass lesion identified in the colon.  The rectosigmoid colon is collapsed.  Aorta demonstrates a saccular aneurysm to 43 mm.  No retroperitoneal periportal lymphadenopathy.  Mesenteric disease.  No free fluid the pelvis.  Post prostatectomy.  No pelvic lymphadenopathy.  View of the bone windows demonstrates extensive round sclerotic lesions throughout the pelvis, spine, sternum, and femurs, and humeri.  There is interval advancement of the sclerotic metastasis  compared to CT of the chest of 12/15/2012.  Particulate there are more sclerotic lesions within the sternum enlarging lesions in the thoracic spine.  IMPRESSION:  1.  Nodular enlargement of the left adrenal gland is highly concerning for adrenal gland metastasis. 2.  Interval advancement of widespread osseous metastasis present on prior exam. 3.  Infrarenal abdominal aortic aneurysm to 43 mm.  4.  Stasis of the distal enteric contents without obstructing lesion identified   Original Report Authenticated By: Genevive Bi, M.D.   Nm Bone Scan Whole Body  07/24/2013   *RADIOLOGY REPORT*  Clinical Data: Prostate cancer.  NUCLEAR MEDICINE WHOLE BODY BONE SCINTIGRAPHY  Technique:  Whole body anterior and posterior images were obtained approximately 3 hours after intravenous injection of radiopharmaceutical.  Radiopharmaceutical: CURIE TC-MDP TECHNETIUM TC 60M MEDRONATE IV KIT  Comparison: 12/22/2011.  Findings: Interval development of diffuse osseous metastatic disease involving the axial and appendicular skeleton.  Numerous lesions involving the shoulders, ribs, thoracic and lumbar  vertebral bodies, pelvis, both hips, both femora and both tibias.  IMPRESSION: Interval development of diffuse osseous metastatic disease.   Original Report Authenticated By: Rudie Meyer, M.D.   Ct Abdomen Pelvis W Contrast  07/24/2013   *RADIOLOGY REPORT*  Clinical Data:  Prostate cancer  CT CHEST, ABDOMEN AND PELVIS WITH CONTRAST  Technique:  Multidetector CT imaging of the chest, abdomen and pelvis was performed following the standard protocol during bolus administration of intravenous contrast.  Contrast: OMNIPAQUE IOHEXOL 300 MG/ML  SOLN  Comparison:  CT 12/15/2012  CT CHEST  Findings:  No axillary or supraclavicular lymphadenopathy.  No mediastinal or hilar lymphadenopathy.  No pericardial fluid.  Review of the lung windows demonstrates no suspicious pulmonary nodules.  IMPRESSION: No evidence of thoracic metastasis in the soft tissues.  CT ABDOMEN AND PELVIS  Findings:  No focal hepatic lesion.  Gallbladder is absent.  The pancreas and spleen are normal.  There is a rounded enlargement of the left adrenal gland to 22 mm.  There is mild thickening of the right adrenal gland to 11 mm. Nonenhancing cyst extend from the left and right kidney.  Stomach is normal.  There is some stasis of enteric contents in the distal ileum leading up to the terminal ileum.  No obstructing lesions identified.  Stool in the right colon.  There is no obstructing mass lesion identified in the colon.  The rectosigmoid colon is collapsed.  Aorta demonstrates a saccular aneurysm to 43 mm.  No retroperitoneal periportal lymphadenopathy.  Mesenteric disease.  No free fluid the pelvis.  Post prostatectomy.  No pelvic lymphadenopathy.  View of the bone windows demonstrates extensive round sclerotic lesions throughout the pelvis, spine, sternum, and femurs, and humeri.  There is interval advancement of the sclerotic metastasis compared to CT of the chest of 12/15/2012.  Particulate there are more sclerotic lesions within the  sternum enlarging lesions in the thoracic spine.  IMPRESSION:  1.  Nodular enlargement of the left adrenal gland is highly concerning for adrenal gland metastasis. 2.  Interval advancement of widespread osseous metastasis present on prior exam. 3.  Infrarenal abdominal aortic aneurysm to 43 mm.  4.  Stasis of the distal enteric contents without obstructing lesion identified   Original Report Authenticated By: Genevive Bi, M.D.     IMPRESSION: This patient is a very nice 79 year old gentleman with castration resistant metastatic prostate cancer and skeletal involvement. He appears to be a good candidate for a radium-223.  PLAN:Today, I talked to the patient  and family about the findings and work-up thus far.  We discussed the natural history of disease and general treatment, highlighting the role of Xofigo in the management.  We reviewed the anticipated acute and late sequelae associated with Xofigo in this setting.  The patient was encouraged to ask questions that I answered to the best of my ability.  I filled out a patient counseling form during our discussion including treatment diagrams.  We retained a copy for our records.  The patient would like to proceed with [facility] and will be scheduled infusion.  I spent 30 minutes minutes face to face with the patient and more than 50% of that time was spent in counseling and/or coordination of care.   ------------------------------------------------  Artist Pais. Kathrynn Running, M.D.

## 2013-08-13 NOTE — Progress Notes (Signed)
  Radiation Oncology         [phone] ________________________________  Name: [name] MRN: [id number]  Date: 08/13/2013  DOB: 1933/10/16   To whom it may concern:   Aaron Schaefer is a patient with metastatic, castrate resistant prostate cancer with predominantly bone disease.  For details of his clinical history, please see the previously detailed consultation report that can be supplied with this letter.  I have recommended Radium 223 Dichloride treatment for monthly IV infusion over six months.  Based upon the randomized phase III Alsympca trial, we would expect an improvement in the patient's overall survival, improved pain control, decreased narcotic requirements, and decreased probability of skeletal related adverse events moving forward.  Given the data and this patient's situation, I feel that the delivery of [name] is medical necessity.  Please call if any further information is required to aid in this patient's treatment.    Sincerely yours,  Artist Pais. Kathrynn Running, M.D.   -----------------------------------------------------------------------------------------------------------  Ref:  Lupita Shutter, Sherril Cong, et al., Alpha emitter radium-223 and survival in metastatic prostate cancer. [facility] Med. 2013 Jul 18;369(3):213-23

## 2013-08-19 ENCOUNTER — Other Ambulatory Visit (HOSPITAL_BASED_OUTPATIENT_CLINIC_OR_DEPARTMENT_OTHER): Payer: Medicare Other | Admitting: Lab

## 2013-08-19 ENCOUNTER — Ambulatory Visit (HOSPITAL_BASED_OUTPATIENT_CLINIC_OR_DEPARTMENT_OTHER): Payer: Medicare Other | Admitting: Oncology

## 2013-08-19 ENCOUNTER — Ambulatory Visit: Payer: Medicare Other | Admitting: Oncology

## 2013-08-19 ENCOUNTER — Telehealth: Payer: Self-pay | Admitting: Oncology

## 2013-08-19 ENCOUNTER — Other Ambulatory Visit: Payer: Medicare Other | Admitting: Lab

## 2013-08-19 VITALS — BP 122/80 | HR 93 | Temp 97.6°F | Resp 18 | Ht 66.0 in | Wt 157.9 lb

## 2013-08-19 DIAGNOSIS — C61 Malignant neoplasm of prostate: Secondary | ICD-10-CM

## 2013-08-19 DIAGNOSIS — D6481 Anemia due to antineoplastic chemotherapy: Secondary | ICD-10-CM

## 2013-08-19 DIAGNOSIS — C7951 Secondary malignant neoplasm of bone: Secondary | ICD-10-CM

## 2013-08-19 LAB — COMPREHENSIVE METABOLIC PANEL (CC13)
Albumin: 3.2 g/dL — ABNORMAL LOW (ref 3.5–5.0)
Alkaline Phosphatase: 195 U/L — ABNORMAL HIGH (ref 40–150)
BUN: 17.4 mg/dL (ref 7.0–26.0)
Glucose: 99 mg/dl (ref 70–140)
Potassium: 4.4 mEq/L (ref 3.5–5.1)
Total Bilirubin: 0.69 mg/dL (ref 0.20–1.20)

## 2013-08-19 LAB — CBC WITH DIFFERENTIAL/PLATELET
Basophils Absolute: 0.1 10*3/uL (ref 0.0–0.1)
EOS%: 5.3 % (ref 0.0–7.0)
HCT: 30.2 % — ABNORMAL LOW (ref 38.4–49.9)
HGB: 9.2 g/dL — ABNORMAL LOW (ref 13.0–17.1)
LYMPH%: 26.4 % (ref 14.0–49.0)
MCH: 24.7 pg — ABNORMAL LOW (ref 27.2–33.4)
MCV: 81.2 fL (ref 79.3–98.0)
MONO%: 8.5 % (ref 0.0–14.0)
NEUT%: 58.9 % (ref 39.0–75.0)
Platelets: 235 10*3/uL (ref 140–400)

## 2013-08-19 NOTE — Progress Notes (Signed)
Hematology and Oncology Follow Up Visit  Aaron Schaefer [id number] September 12, 1933 79 y.o. 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 Initial diagnosis was in 2001. Gleason score is 3+4 = 7. PSA was 4.96.  Prior Therapy:  1. S/P prostateectomy followed by radiation therapy at the time of diagnosis. 2. Started on degarelix on 11/21/10 due to a rising PSA. This was switched to Lupron in March 2012 due to pain at the injection site. 3. He developed bone mets at T3, T4, & T5 and was started on Casodex and Xgeva on 12/22/11. 4. Received Provenge 02/15/12 through 03/14/12. 5. He received Xtandi from June 2013 to September 2013. 6. S/P Systemic chemotherapy with Taxotere started on 09/16/12. S/P 5 cycles. Treatment stopped in 12/2012 due to poor tolerance.  7.  Zytiga 1000 mg daily on from 03/03/13 to 07/2013. This was stopped due to progression of disease.   Current therapy:  He remains on Lupron and Xgeva at [facility]. He is about to start Xofigo in the next few weeks.   Interim History:  Aaron Schaefer is seen today for routine follow-up. Since the last visit, he is feeling better especially after stopping Zytiga. 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 He still has lower extremity edema which is improving and he is able to wear shoes better.  No SOB or dyspnea. He is reporting less diffuse bone pain in his shoulders and hips. He is using hydrocodone with some help. He is reporting better by mouth intake and a better appetite. He does not have any localizing symptoms or any neurological deficits.  Medications: I have reviewed the patient's current medications.   Current Outpatient Prescriptions  Medication Sig Dispense Refill  . calcium carbonate (OS-CAL) 600 MG TABS Take 600 mg by mouth 2 (two) times daily with a meal.       . furosemide (LASIX) 20 MG tablet Take 1 tablet (20 mg total) by mouth daily.  30 tablet  2  . HYDROcodone-acetaminophen (NORCO/VICODIN) 5-325 MG per tablet Take 1 tablet by mouth every 6 (six) hours as needed for pain.  30 tablet  0  . hydrOXYzine (ATARAX/VISTARIL) 25 MG tablet Take 1 tablet (25 mg total) by mouth every 6 (six) hours as needed for itching.  30 tablet  0  . loperamide (IMODIUM A-D) 2 MG tablet Take 2 mg by mouth 4 (four) times daily as needed.      . megestrol (MEGACE ORAL) 40 MG/ML suspension Take 5 mLs (200 mg total) by mouth 2 (two) times daily.  240 mL  0  . ondansetron (ZOFRAN) 8 MG tablet Take 8 mg by mouth every 8 (eight) hours as needed.      . potassium chloride SA (K-DUR,KLOR-CON) 20 MEQ tablet Take 1 tablet (20 mEq total) by mouth daily.  30 tablet  1  . psyllium (METAMUCIL) 58.6 % packet Take 1 packet by mouth daily.      . simvastatin (ZOCOR) 80 MG tablet Take 80 mg by mouth at bedtime.       No current facility-administered medications for this visit.    Allergies: No Known Allergies  Past Medical History, Surgical history, Social history, and Family History were reviewed and updated.  Review of Systems: Remaining ROS negative.  Physical Exam: Blood pressure 122/80, pulse 93, temperature 97.6 F (36.4  C), temperature source Oral, resp. rate 18, height 5\' 6"  (1.676 m), weight 157 lb 14.4 oz (71.623 kg), SpO2 100.00%. ECOG: 1  General appearance: alert, cooperative and no distress Head: Normocephalic, without obvious abnormality, atraumatic Neck: no adenopathy, no carotid bruit, no JVD, supple, symmetrical, trachea midline and thyroid not enlarged, symmetric, no tenderness/mass/nodules Lymph nodes: Cervical, supraclavicular, and axillary nodes normal. Heart:regular rate and rhythm, S1, S2 normal, no murmur, click, rub or gallop Lung:chest clear, no wheezing, rales, normal symmetric air entry, no tachypnea, retractions or cyanosis Abdomen: soft, non-tender,  without masses or organomegaly EXT:no erythema, induration, or nodules. Trace edema to the BLE. Neuro: no deficits.   Lab Results: Lab Results  Component Value Date   WBC 4.9 07/17/2013   HGB 9.9* 07/17/2013   HCT 30.6* 07/17/2013   MCV 81.2 07/17/2013   PLT 242 07/17/2013     Chemistry      Component Value Date/Time   NA 141 07/17/2013 0822   NA 140 12/15/2012 1353   K 4.0 07/17/2013 0822   K 3.9 12/15/2012 1353   CL 105 05/08/2013 0856   CL 104 12/15/2012 1353   CO2 27 07/17/2013 0822   CO2 26 12/15/2012 1353   BUN 12.4 07/17/2013 0822   BUN 17 12/15/2012 1353   CREATININE 0.8 07/17/2013 0822   CREATININE 0.87 12/15/2012 1353      Component Value Date/Time   CALCIUM 8.8 07/17/2013 0822   CALCIUM 8.3* 12/15/2012 1353   ALKPHOS 183* 07/17/2013 0822   ALKPHOS 102 12/15/2012 1353   AST 27 07/17/2013 0822   AST 18 12/15/2012 1353   ALT 8 07/17/2013 0822   ALT 9 12/15/2012 1353   BILITOT 1.00 07/17/2013 0822   BILITOT 1.2 12/15/2012 1353        Impression and Plan: This is a 79 year old gentleman with the following issues:  1. Castration resistant prostate cancer. He is status post multiple treatments as mentioned above and recently progressed on Zytiga. He was evaluated by Dr. Kathrynn Running and is about to start Xofigo in the next few weeks. We will continue to follow him closely and measure his PSA periodically to assess the success of this treatment.  2. Protein calorie malnutrition. Still him much improvement with Megace will continue to monitor that.  4. Bony disease. He remains on Xgeva monthly at [facility].  5. Androgen deprivation. He remains on Lupron at [facility].  6. Anemia. Due to chemotherapy. No active bleeding. No transfusion is indicated.   7. LE edema. Improved at this time  8. Bone pain: He is using hydrocodone for now.   9. Follow-up. In 4 to 6 weeks.      [facility] 9/9/20143:22 PM

## 2013-08-19 NOTE — Telephone Encounter (Signed)
gv and printed appt sched and avs for pt for OCT....sent pt back to lab

## 2013-08-20 ENCOUNTER — Other Ambulatory Visit: Payer: Self-pay | Admitting: Oncology

## 2013-08-28 ENCOUNTER — Other Ambulatory Visit: Payer: Self-pay | Admitting: Radiation Oncology

## 2013-08-28 DIAGNOSIS — C61 Malignant neoplasm of prostate: Secondary | ICD-10-CM

## 2013-09-03 ENCOUNTER — Ambulatory Visit: Payer: Medicare Other | Admitting: Oncology

## 2013-09-03 ENCOUNTER — Other Ambulatory Visit: Payer: Medicare Other | Admitting: Lab

## 2013-09-09 ENCOUNTER — Encounter (HOSPITAL_COMMUNITY): Payer: Self-pay

## 2013-09-09 ENCOUNTER — Encounter (HOSPITAL_COMMUNITY)
Admission: RE | Admit: 2013-09-09 | Discharge: 2013-09-09 | Disposition: A | Discharge status: Home / Self Care | Payer: Medicare Other | Source: Ambulatory Visit | Attending: Radiation Oncology | Admitting: Radiation Oncology

## 2013-09-09 DIAGNOSIS — C7951 Secondary malignant neoplasm of bone: Secondary | ICD-10-CM | POA: Insufficient documentation

## 2013-09-09 DIAGNOSIS — C61 Malignant neoplasm of prostate: Secondary | ICD-10-CM | POA: Insufficient documentation

## 2013-09-10 ENCOUNTER — Encounter: Payer: Self-pay | Admitting: Radiation Oncology

## 2013-09-10 NOTE — Progress Notes (Addendum)
  Radiation Oncology         ([phone] ________________________________  Name: Aaron Schaefer MRN: [id number]  Date: 09/09/2013  DOB: January 04, 1933  Radium-223 Infusion Note  Diagnosis:  Castration resistant prostate cancer with painful bone involvement  Current Infusion:    1  Planned Infusions:  6  Narrative: [name] presented to nuclear medicine for treatment. His most recent blood counts were reviewed.  He remains a good candidate to proceed with Ra-223.  The patient was situated in an infusion suite with a contact barrier placed under his arm. Intravenous access was established, using sterile technique, and a normal saline infusion from a syringe was started.  Micro-dosimetry:  The prescribed radiation activity was assayed and confirmed to be within specified tolerance.  Special Treatment Procedure - Infusion:  The nuclear medicine technologist and I personally verified the dose activity to be delivered as specified in the written directive, and verified the patient identification via 2 separate methods.  The syringe containing the dose was attached to a 3 way stopcock, and then the valve was opened to the patient, and the dose delivered over a minute. No complications were noted.  The total administered dose was 95.2 microcuries (calibration of 98.3 Ci minus residual 3.1 Ci).  A saline flush of the line and the syringe that contained the isotope was then performed.  The entire IV tubing, venocatheter, stopcock and syringes was removed in total, placed in a disposal bag and sent for assay of the residual activity, which will be reported at a later time in our EMR by the physics staff.  Pressure was applied to the venipuncture site, and a compression bandage placed.  Radiation Safety personnel were present to perform the discharge survey, as detailed on their documentation.   After a short period of observation, the patient had his IV removed.  Impression:  The patient tolerated  his infusion relatively well.  Plan:  The patient will return in one month for ongoing care.    ------------------------------------------------        Maryln Gottron, MD   Addendum:  The residual radioactivity reported later by was Radiation Safety was 3.1 microcuries, so the actual infused isotope activity was 95.2 microcuries.

## 2013-09-15 ENCOUNTER — Other Ambulatory Visit: Payer: Self-pay | Admitting: *Deleted

## 2013-09-15 MED ORDER — OXYCODONE HCL 5 MG PO TABS: 5.0000 mg | ORAL_TABLET | ORAL | Status: DC | PRN

## 2013-09-15 NOTE — Telephone Encounter (Signed)
Pt called states Hydrocodone is not taking the pain away is there something else MD could give him. Reviewed with MD, Rx for oxycodone 5mg  q4hrs PRN Q#50. Pt notfied rx is ready for pick up

## 2013-09-15 NOTE — Telephone Encounter (Signed)
error 

## 2013-09-22 ENCOUNTER — Other Ambulatory Visit: Payer: Self-pay | Admitting: Oncology

## 2013-10-01 ENCOUNTER — Other Ambulatory Visit (HOSPITAL_BASED_OUTPATIENT_CLINIC_OR_DEPARTMENT_OTHER): Payer: Medicare Other | Admitting: Lab

## 2013-10-01 ENCOUNTER — Ambulatory Visit (HOSPITAL_BASED_OUTPATIENT_CLINIC_OR_DEPARTMENT_OTHER): Payer: Medicare Other | Admitting: Oncology

## 2013-10-01 ENCOUNTER — Telehealth: Payer: Self-pay | Admitting: Oncology

## 2013-10-01 VITALS — BP 110/72 | HR 98 | Temp 97.6°F | Resp 20 | Ht 66.0 in | Wt 156.0 lb

## 2013-10-01 DIAGNOSIS — C61 Malignant neoplasm of prostate: Secondary | ICD-10-CM

## 2013-10-01 DIAGNOSIS — C7951 Secondary malignant neoplasm of bone: Secondary | ICD-10-CM

## 2013-10-01 DIAGNOSIS — E46 Unspecified protein-calorie malnutrition: Secondary | ICD-10-CM

## 2013-10-01 DIAGNOSIS — E291 Testicular hypofunction: Secondary | ICD-10-CM

## 2013-10-01 LAB — COMPREHENSIVE METABOLIC PANEL (CC13)
Albumin: 2.8 g/dL — ABNORMAL LOW (ref 3.5–5.0)
Anion Gap: 14 mEq/L — ABNORMAL HIGH (ref 3–11)
CO2: 20 mEq/L — ABNORMAL LOW (ref 22–29)
Calcium: 9.2 mg/dL (ref 8.4–10.4)
Glucose: 93 mg/dl (ref 70–140)
Potassium: 4.2 mEq/L (ref 3.5–5.1)
Sodium: 141 mEq/L (ref 136–145)
Total Bilirubin: 0.54 mg/dL (ref 0.20–1.20)
Total Protein: 7.4 g/dL (ref 6.4–8.3)

## 2013-10-01 LAB — CBC WITH DIFFERENTIAL/PLATELET
Eosinophils Absolute: 0.2 10*3/uL (ref 0.0–0.5)
LYMPH%: 20.7 % (ref 14.0–49.0)
MONO#: 0.4 10*3/uL (ref 0.1–0.9)
NEUT#: 3.1 10*3/uL (ref 1.5–6.5)
Platelets: 219 10*3/uL (ref 140–400)
RBC: 3.31 10*6/uL — ABNORMAL LOW (ref 4.20–5.82)
WBC: 4.7 10*3/uL (ref 4.0–10.3)

## 2013-10-01 LAB — PSA: PSA: 769.9 ng/mL — ABNORMAL HIGH (ref <=4.00)

## 2013-10-01 NOTE — Telephone Encounter (Signed)
gv and printed appt sched and avs for pt for NOV. pt wanted earlier appt on 11.20.14

## 2013-10-01 NOTE — Progress Notes (Signed)
Hematology and Oncology Follow Up Visit  Aaron Schaefer [id number] [birth year] 79 y.o. 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 Initial diagnosis was in 2001. Gleason score is 3+4 = 7. PSA was 4.96.  Prior Therapy:  1. S/P prostateectomy followed by radiation therapy at the time of diagnosis. 2. Started on degarelix on 11/21/10 due to a rising PSA. This was switched to Lupron in March 2012 due to pain at the injection site. 3. He developed bone mets at T3, T4, & T5 and was started on Casodex and Xgeva on 12/22/11. 4. Received Provenge 02/15/12 through 03/14/12. 5. He received Xtandi from June 2013 to September 2013. 6. S/P Systemic chemotherapy with Taxotere started on 09/16/12. S/P 5 cycles. Treatment stopped in 12/2012 due to poor tolerance.  7.  Zytiga 1000 mg daily on from 03/03/13 to 07/2013. This was stopped due to progression of disease.   Current therapy:  He remains on Lupron and Xgeva at [facility]. He is on Xofigo with one treatment given so far.    Interim History:  [name] is seen today for routine follow-up. Since the last visit, he is feeling about the same. 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 He still has lower extremity edema which is improving and he is able to wear shoes better.  No SOB or dyspnea. He is reporting less diffuse bone pain in his shoulders and hips. He is using hydrocodone with some help. He is reporting better by mouth intake and a better appetite. He does not have any localizing symptoms or any neurological deficits. His weight has been stable but lost one lb since since last visit.   Medications: I have reviewed the patient's current medications.   Current Outpatient Prescriptions  Medication Sig Dispense Refill  . calcium carbonate (OS-CAL) 600 MG TABS Take 600 mg  by mouth 2 (two) times daily with a meal.      . hydrOXYzine (ATARAX/VISTARIL) 25 MG tablet Take 1 tablet (25 mg total) by mouth every 6 (six) hours as needed for itching.  30 tablet  0  . loperamide (IMODIUM A-D) 2 MG tablet Take 2 mg by mouth 4 (four) times daily as needed.      . megestrol (MEGACE) 40 MG/ML suspension TAKE 5 ML BY MOUTH TWICE A DAY  240 mL  0  . ondansetron (ZOFRAN) 8 MG tablet Take 8 mg by mouth every 8 (eight) hours as needed.      Marland Kitchen oxyCODONE (OXY IR/ROXICODONE) 5 MG immediate release tablet Take 1 tablet (5 mg total) by mouth every 4 (four) hours as needed for pain.  50 tablet  0  . psyllium (METAMUCIL) 58.6 % packet Take 1 packet by mouth daily.      . simvastatin (ZOCOR) 80 MG tablet Take 80 mg by mouth at bedtime.       No current facility-administered medications for this visit.    Allergies: No Known Allergies  Past Medical History, Surgical history, Social history, and Family History were reviewed and updated.  Review of Systems: Remaining ROS negative.  Physical Exam: Blood pressure 110/72, pulse 98, temperature 97.6 F (36.4 C), temperature source Oral, resp. rate 20, height 5\' 6"  (1.676 m), weight 156 lb (70.761 kg). ECOG: 1  General appearance: alert, cooperative and no distress Head: Normocephalic, without obvious abnormality,  atraumatic Neck: no adenopathy, no carotid bruit, no JVD, supple, symmetrical, trachea midline and thyroid not enlarged, symmetric, no tenderness/mass/nodules Lymph nodes: Cervical, supraclavicular, and axillary nodes normal. Heart:regular rate and rhythm, S1, S2 normal, no murmur, click, rub or gallop Lung:chest clear, no wheezing, rales, normal symmetric air entry, no tachypnea, retractions or cyanosis Abdomen: soft, non-tender, without masses or organomegaly EXT:no erythema, induration, or nodules. Trace edema to the BLE. Neuro: no deficits.   Lab Results: Lab Results  Component Value Date   WBC 4.7 10/01/2013   HGB  8.2* 10/01/2013   HCT 26.1* 10/01/2013   MCV 78.9* 10/01/2013   PLT 219 10/01/2013     Chemistry      Component Value Date/Time   NA 139 08/19/2013 1445   NA 140 12/15/2012 1353   K 4.4 08/19/2013 1445   K 3.9 12/15/2012 1353   CL 105 05/08/2013 0856   CL 104 12/15/2012 1353   CO2 22 08/19/2013 1445   CO2 26 12/15/2012 1353   BUN 17.4 08/19/2013 1445   BUN 17 12/15/2012 1353   CREATININE 0.9 08/19/2013 1445   CREATININE 0.87 12/15/2012 1353      Component Value Date/Time   CALCIUM 8.9 08/19/2013 1445   CALCIUM 8.3* 12/15/2012 1353   ALKPHOS 195* 08/19/2013 1445   ALKPHOS 102 12/15/2012 1353   AST 27 08/19/2013 1445   AST 18 12/15/2012 1353   ALT 16 08/19/2013 1445   ALT 9 12/15/2012 1353   BILITOT 0.69 08/19/2013 1445   BILITOT 1.2 12/15/2012 1353        Impression and Plan: This is a 79 year old gentleman with the following issues:  1. Castration resistant prostate cancer. He is status post multiple treatments as mentioned above and recently progressed on Zytiga. He is on Xofigo and has been well tolerated.   2. Protein calorie malnutrition. Still him much improvement with Megace will continue to monitor that.  4. Bony disease. He remains on Xgeva monthly at [facility].  5. Androgen deprivation. He remains on Lupron at [facility].  6. Anemia. Due to chemotherapy. No active bleeding. No transfusion is indicated.   7. LE edema. Improved at this time  8. Bone pain: He is using hydrocodone for now.   9. Follow-up. In 4 to 6 weeks.      [name] 10/22/20149:32 AM

## 2013-10-13 ENCOUNTER — Other Ambulatory Visit: Payer: Self-pay | Admitting: Radiation Oncology

## 2013-10-13 DIAGNOSIS — C61 Malignant neoplasm of prostate: Secondary | ICD-10-CM

## 2013-10-14 ENCOUNTER — Other Ambulatory Visit: Payer: Self-pay | Admitting: *Deleted

## 2013-10-14 ENCOUNTER — Telehealth: Payer: Self-pay | Admitting: *Deleted

## 2013-10-14 MED ORDER — GABAPENTIN 100 MG PO CAPS: 100.0000 mg | ORAL_CAPSULE | Freq: Three times a day (TID) | ORAL | Status: DC

## 2013-10-14 NOTE — Telephone Encounter (Signed)
Patient calling to say pain in his legs is excruiating and tingling up to his thighs, is using pain medication, is there anything else? Per dr Clelia Croft, neurontin called to [facility] [address]. See mar. Patient notified.

## 2013-10-14 NOTE — Telephone Encounter (Signed)
CALLED PATIENT TO INFORM OF XOFIGO INJECTION ON 10-31-13 AT [facility], SPOKE WITH PATIENT AND HE IS AWARE OF THIS INJECTION.

## 2013-10-14 NOTE — Telephone Encounter (Signed)
No note

## 2013-10-16 ENCOUNTER — Other Ambulatory Visit: Payer: Self-pay

## 2013-10-27 ENCOUNTER — Telehealth: Payer: Self-pay | Admitting: Medical Oncology

## 2013-10-27 NOTE — Telephone Encounter (Signed)
Patient's wife called concerned with patient's health status. 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 Spouse reports patient is "so tired physically, has no stamina and is not doing well."   They are scheduled to go to [place] on Monday and asking is there any reason why they shouldn't go? As well as, is there anything that can be done "regarding his stamina?" Spouse states patient's pain in legs "more intense, appetite still not good and I'm concerned about his weight loss." Confirmed with spouse patient is taking pain medication, neurontin and megace. Informed spouse that patient has scheduled appt Thursday, 11/20 with labs/MD and MD may wish to wait till scheduled appt to address these issues, but will give MD message regarding her concerns. Patient expressed thanks and confirmed appt. Knows to call office with any questions or concerns.  LOV  10/01/13 Next sched appt lab/MD 10/30/13

## 2013-10-30 ENCOUNTER — Ambulatory Visit (HOSPITAL_COMMUNITY)
Admission: RE | Admit: 2013-10-30 | Discharge: 2013-10-30 | Disposition: A | Discharge status: Home / Self Care | Payer: Medicare Other | Source: Ambulatory Visit | Attending: Oncology | Admitting: Oncology

## 2013-10-30 ENCOUNTER — Ambulatory Visit (HOSPITAL_BASED_OUTPATIENT_CLINIC_OR_DEPARTMENT_OTHER): Payer: Medicare Other | Admitting: Oncology

## 2013-10-30 ENCOUNTER — Other Ambulatory Visit: Payer: Self-pay | Admitting: Radiation Oncology

## 2013-10-30 ENCOUNTER — Other Ambulatory Visit: Payer: Medicare Other | Admitting: Lab

## 2013-10-30 ENCOUNTER — Telehealth: Payer: Self-pay | Admitting: *Deleted

## 2013-10-30 ENCOUNTER — Telehealth: Payer: Self-pay | Admitting: Oncology

## 2013-10-30 ENCOUNTER — Other Ambulatory Visit: Payer: Self-pay | Admitting: *Deleted

## 2013-10-30 ENCOUNTER — Other Ambulatory Visit (HOSPITAL_BASED_OUTPATIENT_CLINIC_OR_DEPARTMENT_OTHER): Payer: Medicare Other | Admitting: Lab

## 2013-10-30 VITALS — BP 127/67 | HR 109 | Temp 97.4°F | Resp 19 | Ht 66.0 in | Wt 148.7 lb

## 2013-10-30 DIAGNOSIS — D539 Nutritional anemia, unspecified: Secondary | ICD-10-CM

## 2013-10-30 DIAGNOSIS — C61 Malignant neoplasm of prostate: Secondary | ICD-10-CM

## 2013-10-30 DIAGNOSIS — C7951 Secondary malignant neoplasm of bone: Secondary | ICD-10-CM

## 2013-10-30 DIAGNOSIS — E291 Testicular hypofunction: Secondary | ICD-10-CM

## 2013-10-30 DIAGNOSIS — D649 Anemia, unspecified: Secondary | ICD-10-CM

## 2013-10-30 LAB — COMPREHENSIVE METABOLIC PANEL (CC13)
ALT: 15 U/L (ref 0–55)
AST: 30 U/L (ref 5–34)
Albumin: 2.7 g/dL — ABNORMAL LOW (ref 3.5–5.0)
Alkaline Phosphatase: 166 U/L — ABNORMAL HIGH (ref 40–150)
Calcium: 9.3 mg/dL (ref 8.4–10.4)
Chloride: 107 mEq/L (ref 98–109)
Creatinine: 0.9 mg/dL (ref 0.7–1.3)
Potassium: 4.9 mEq/L (ref 3.5–5.1)

## 2013-10-30 LAB — CBC WITH DIFFERENTIAL/PLATELET
BASO%: 0.4 % (ref 0.0–2.0)
EOS%: 2.5 % (ref 0.0–7.0)
MCH: 24.5 pg — ABNORMAL LOW (ref 27.2–33.4)
MCHC: 30.9 g/dL — ABNORMAL LOW (ref 32.0–36.0)
RDW: 21 % — ABNORMAL HIGH (ref 11.0–14.6)
lymph#: 1 10*3/uL (ref 0.9–3.3)

## 2013-10-30 LAB — HOLD TUBE, BLOOD BANK

## 2013-10-30 NOTE — Telephone Encounter (Signed)
pt sent back to lab and given appt schedule for november and december.

## 2013-10-30 NOTE — Telephone Encounter (Signed)
Called patient to ask to come in early tomorrow at 1:45 pm to get his weight prior to getting his injection on 10-31-13  lvm for a return call.

## 2013-10-30 NOTE — Progress Notes (Signed)
Hematology and Oncology Follow Up Visit  Aaron Schaefer [id number] 08-Feb-1933 79 y.o. 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 Initial diagnosis was in 2001. Gleason score is 3+4 = 7. PSA was 4.96.  Prior Therapy:  1. S/P prostateectomy followed by radiation therapy at the time of diagnosis. 2. Started on degarelix on 11/21/10 due to a rising PSA. This was switched to Lupron in March 2012 due to pain at the injection site. 3. He developed bone mets at T3, T4, & T5 and was started on Casodex and Xgeva on 12/22/11. 4. Received Provenge 02/15/12 through 03/14/12. 5. He received Xtandi from June 2013 to September 2013. 6. S/P Systemic chemotherapy with Taxotere started on 09/16/12. S/P 5 cycles. Treatment stopped in 12/2012 due to poor tolerance.  7.  Zytiga 1000 mg daily on from 03/03/13 to 07/2013. This was stopped due to progression of disease.   Current therapy:  He remains on Lupron and Xgeva at [facility]. He is on Xofigo with one treatment given so far.    Interim History:  Aaron Schaefer is seen today for routine follow-up. Since the last visit, he is feeling very weak and lethargic. 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 He still has lower extremity edema which is improving and he is able to wear shoes better.  No SOB or dyspnea. He is reporting less diffuse bone pain in his shoulders and hips. He is using hydrocodone with some help. He is reporting better by mouth intake and a better appetite. He does not have any localizing symptoms or any neurological deficits.   Medications: I have reviewed the patient's current medications.   Current Outpatient Prescriptions  Medication Sig Dispense Refill  . calcium carbonate (OS-CAL) 600 MG TABS Take 600 mg by  mouth 2 (two) times daily with a meal.      . gabapentin (NEURONTIN) 100 MG capsule Take 1 capsule (100 mg total) by mouth 3 (three) times daily.  90 capsule  1  . hydrOXYzine (ATARAX/VISTARIL) 25 MG tablet Take 1 tablet (25 mg total) by mouth every 6 (six) hours as needed for itching.  30 tablet  0  . loperamide (IMODIUM A-D) 2 MG tablet Take 2 mg by mouth 4 (four) times daily as needed.      . megestrol (MEGACE) 40 MG/ML suspension TAKE 5 ML BY MOUTH TWICE A DAY  240 mL  0  . ondansetron (ZOFRAN) 8 MG tablet Take 8 mg by mouth every 8 (eight) hours as needed.      Marland Kitchen oxyCODONE (OXY IR/ROXICODONE) 5 MG immediate release tablet Take 1 tablet (5 mg total) by mouth every 4 (four) hours as needed for pain.  50 tablet  0  . psyllium (METAMUCIL) 58.6 % packet Take 1 packet by mouth daily.      . simvastatin (ZOCOR) 80 MG tablet Take 80 mg by mouth at bedtime.       No current facility-administered medications for this visit.    Allergies: No Known Allergies  Past Medical History, Surgical history, Social history, and Family History were reviewed and updated.  Review of Systems: Remaining ROS negative.  Physical Exam: Blood pressure 127/67, pulse 109, temperature 97.4 F (36.3 C), temperature source Oral, resp. rate 19, height 5\' 6"  (  1.676 m), weight 148 lb 11.2 oz (67.45 kg), SpO2 100.00%. ECOG: 1  General appearance: alert, cooperative and no distress Head: Normocephalic, without obvious abnormality, atraumatic Neck: no adenopathy, no carotid bruit, no JVD, supple, symmetrical, trachea midline and thyroid not enlarged, symmetric, no tenderness/mass/nodules Lymph nodes: Cervical, supraclavicular, and axillary nodes normal. Heart:regular rate and rhythm, S1, S2 normal, no murmur, click, rub or gallop Lung:chest clear, no wheezing, rales, normal symmetric air entry, no tachypnea, retractions or cyanosis Abdomen: soft, non-tender, without masses or organomegaly EXT:no erythema, induration, or  nodules. Trace edema to the BLE. Neuro: no deficits.   Lab Results: Lab Results  Component Value Date   WBC 6.0 10/30/2013   HGB 6.6* 10/30/2013   HCT 21.4* 10/30/2013   MCV 79.2* 10/30/2013   PLT 202 10/30/2013     Chemistry      Component Value Date/Time   NA 141 10/30/2013 1439   NA 140 12/15/2012 1353   K 4.9 10/30/2013 1439   K 3.9 12/15/2012 1353   CL 105 05/08/2013 0856   CL 104 12/15/2012 1353   CO2 20* 10/30/2013 1439   CO2 26 12/15/2012 1353   BUN 22.7 10/30/2013 1439   BUN 17 12/15/2012 1353   CREATININE 0.9 10/30/2013 1439   CREATININE 0.87 12/15/2012 1353      Component Value Date/Time   CALCIUM 9.3 10/30/2013 1439   CALCIUM 8.3* 12/15/2012 1353   ALKPHOS 166* 10/30/2013 1439   ALKPHOS 102 12/15/2012 1353   AST 30 10/30/2013 1439   AST 18 12/15/2012 1353   ALT 15 10/30/2013 1439   ALT 9 12/15/2012 1353   BILITOT 0.83 10/30/2013 1439   BILITOT 1.2 12/15/2012 1353        Impression and Plan: This is a 79 year old gentleman with the following issues:  1. Castration resistant prostate cancer. He is status post multiple treatments as mentioned above and recently progressed on Zytiga. He is on Xofigo and has been well tolerated.   2. Protein calorie malnutrition. Still him much improvement with Megace will continue to monitor that.  4. Bony disease. He remains on Xgeva monthly at [facility].  5. Androgen deprivation. He remains on Lupron at [facility].  6. Anemia. Likely multifactorial at this time. I have discussed the need for a PRBC and he is willing to do so.   7. LE edema. Improved at this time  8. Bone pain: He is using hydrocodone for now.   9. Follow-up. In 4 to weeks.      Varina Hulon 11/20/20143:35 PM

## 2013-10-31 ENCOUNTER — Ambulatory Visit
Admission: RE | Admit: 2013-10-31 | Discharge: 2013-10-31 | Disposition: A | Discharge status: Home / Self Care | Payer: Medicare Other | Source: Ambulatory Visit | Attending: Radiation Oncology | Admitting: Radiation Oncology

## 2013-10-31 ENCOUNTER — Ambulatory Visit (HOSPITAL_BASED_OUTPATIENT_CLINIC_OR_DEPARTMENT_OTHER): Payer: Medicare Other

## 2013-10-31 ENCOUNTER — Encounter: Payer: Self-pay | Admitting: Radiation Oncology

## 2013-10-31 ENCOUNTER — Ambulatory Visit (HOSPITAL_COMMUNITY)
Admission: RE | Admit: 2013-10-31 | Discharge: 2013-10-31 | Disposition: A | Discharge status: Home / Self Care | Payer: Medicare Other | Source: Ambulatory Visit | Attending: Radiation Oncology | Admitting: Radiation Oncology

## 2013-10-31 ENCOUNTER — Other Ambulatory Visit: Payer: Self-pay | Admitting: *Deleted

## 2013-10-31 VITALS — BP 116/63 | HR 99 | Temp 98.2°F | Resp 18

## 2013-10-31 DIAGNOSIS — C61 Malignant neoplasm of prostate: Secondary | ICD-10-CM | POA: Diagnosis not present

## 2013-10-31 DIAGNOSIS — D539 Nutritional anemia, unspecified: Secondary | ICD-10-CM

## 2013-10-31 DIAGNOSIS — D649 Anemia, unspecified: Secondary | ICD-10-CM

## 2013-10-31 DIAGNOSIS — C7951 Secondary malignant neoplasm of bone: Secondary | ICD-10-CM | POA: Diagnosis not present

## 2013-10-31 LAB — PREPARE RBC (CROSSMATCH)

## 2013-10-31 MED ORDER — SODIUM CHLORIDE 0.9 % IV SOLN
250.0000 mL | Freq: Once | INTRAVENOUS | Status: AC
  Administered 2013-10-31: 250 mL via INTRAVENOUS

## 2013-10-31 MED ORDER — ACETAMINOPHEN 325 MG PO TABS
650.0000 mg | ORAL_TABLET | Freq: Once | ORAL | Status: AC
  Administered 2013-10-31: 650 mg via ORAL

## 2013-10-31 MED ORDER — DIPHENHYDRAMINE HCL 25 MG PO CAPS
ORAL_CAPSULE | ORAL | Status: AC
  Filled 2013-10-31: qty 1

## 2013-10-31 MED ORDER — DIPHENHYDRAMINE HCL 25 MG PO CAPS
25.0000 mg | ORAL_CAPSULE | Freq: Once | ORAL | Status: AC
  Administered 2013-10-31: 25 mg via ORAL

## 2013-10-31 MED ORDER — ACETAMINOPHEN 325 MG PO TABS
ORAL_TABLET | ORAL | Status: AC
  Filled 2013-10-31: qty 2

## 2013-10-31 NOTE — Progress Notes (Signed)
Dr. Kathrynn Running has reviewed labs and approves the patient to move forward with xofigo.

## 2013-10-31 NOTE — Progress Notes (Signed)
  Radiation Oncology         [phone]) [phone] ________________________________  Name: Aaron Schaefer MRN: [id number]  Date: 10/31/2013  DOB: 1933-08-25  Chart Note:  I reviewed this patient's most recent findings and wanted to take a minute to document my impression.  He is scheduled for Xofigo infusion today.  Lab Findings: Lab Results  Component Value Date   WBC 6.0 10/30/2013   HGB 6.6* 10/30/2013   HCT 21.4* 10/30/2013   MCV 79.2* 10/30/2013   PLT 202 10/30/2013   Impression:  His ANC and plt count are adequate for infusion.  He is receiving 2 un PRBC today to help address his anemia  Plan:  I spoke with Dr. Clelia Croft, and at this point, the patient is set up to proceed with Xofigo infusion.  ________________________________  Artist Pais Kathrynn Running, M.D.

## 2013-10-31 NOTE — Progress Notes (Signed)
  Radiation Oncology         [phone] ________________________________  Name: Aaron Schaefer MRN: [id number]  Date: 10/31/2013  DOB: 08-06-1933  Radium-223 Infusion Note  Diagnosis:  Castration resistant prostate cancer with painful bone involvement  Current Infusion:    2  Planned Infusions:  6  Narrative: [name] presented to nuclear medicine for treatment. His most recent blood counts were reviewed.  He remains a good candidate to proceed with Ra-223.  The patient was situated in an infusion suite with a contact barrier placed under his arm. Intravenous access was established, using sterile technique, and a normal saline infusion from a syringe was started.  Micro-dosimetry:  The prescribed radiation activity was assayed and confirmed to be within specified tolerance.  Special Treatment Procedure - Infusion:  The nuclear medicine technologist and I personally verified the dose activity to be delivered as specified in the written directive, and verified the patient identification via 2 separate methods.  The syringe containing the dose was attached to a 3 way stopcock, and then the valve was opened to the patient, and the dose delivered over a minute. No complications were noted.  The total administered dose was 97.2 microcuries in a volume of 6.45 cc.  A saline flush of the line and the syringe that contained the isotope was then performed.  The entire IV tubing, venocatheter, stopcock and syringes was removed in total, placed in a disposal bag and sent for assay of the residual activity, which will be reported at a later time in our EMR by the physics staff.  Pressure was applied to the venipuncture site, and a compression bandage placed.  Radiation Safety personnel were present to perform the discharge survey, as detailed on their documentation.   After a short period of observation, the patient had his IV removed.  Impression:  The patient tolerated his infusion relatively  well.  Plan:  The patient will return in one month for ongoing care.    ________________________________  Artist Pais. Kathrynn Running, M.D.  Addendum:  The residual radioactivity reported later by was Radiation Safety was 3.2 microcuries, so the actual infused isotope activity was 94.0 microcuries.

## 2013-10-31 NOTE — Patient Instructions (Signed)
Blood Transfusion Information WHAT IS A BLOOD TRANSFUSION? A transfusion is the replacement of blood or some of its parts. Blood is made up of multiple cells which provide different functions.  Red blood cells carry oxygen and are used for blood loss replacement.  White blood cells fight against infection.  Platelets control bleeding.  Plasma helps clot blood.  Other blood products are available for specialized needs, such as hemophilia or other clotting disorders. BEFORE THE TRANSFUSION  Who gives blood for transfusions?   You may be able to donate blood to be used at a later date on yourself (autologous donation).  Relatives can be asked to donate blood. This is generally not any safer than if you have received blood from a stranger. The same precautions are taken to ensure safety when a relative's blood is donated.  Healthy volunteers who are fully evaluated to make sure their blood is safe. This is blood bank blood. Transfusion therapy is the safest it has ever been in the practice of medicine. Before blood is taken from a donor, a complete history is taken to make sure that person has no history of diseases nor engages in risky social behavior (examples are intravenous drug use or sexual activity with multiple partners). The donor's travel history is screened to minimize risk of transmitting infections, such as malaria. The donated blood is tested for signs of infectious diseases, such as HIV and hepatitis. The blood is then tested to be sure it is compatible with you in order to minimize the chance of a transfusion reaction. If you or a relative donates blood, this is often done in anticipation of surgery and is not appropriate for emergency situations. It takes many days to process the donated blood. RISKS AND COMPLICATIONS Although transfusion therapy is very safe and saves many lives, the main dangers of transfusion include:   Getting an infectious disease.  Developing a  transfusion reaction. This is an allergic reaction to something in the blood you were given. Every precaution is taken to prevent this. The decision to have a blood transfusion has been considered carefully by your caregiver before blood is given. Blood is not given unless the benefits outweigh the risks. AFTER THE TRANSFUSION  Right after receiving a blood transfusion, you will usually feel much better and more energetic. This is especially true if your red blood cells have gotten low (anemic). The transfusion raises the level of the red blood cells which carry oxygen, and this usually causes an energy increase.  The nurse administering the transfusion will monitor you carefully for complications. HOME CARE INSTRUCTIONS  No special instructions are needed after a transfusion. You may find your energy is better. Speak with your caregiver about any limitations on activity for underlying diseases you may have. SEEK MEDICAL CARE IF:   Your condition is not improving after your transfusion.  You develop redness or irritation at the intravenous (IV) site. SEEK IMMEDIATE MEDICAL CARE IF:  Any of the following symptoms occur over the next 12 hours:  Shaking chills.  You have a temperature by mouth above 102 F (38.9 C), not controlled by medicine.  Chest, back, or muscle pain.  People around you feel you are not acting correctly or are confused.  Shortness of breath or difficulty breathing.  Dizziness and fainting.  You get a rash or develop hives.  You have a decrease in urine output.  Your urine turns a dark color or changes to pink, red, or brown. Any of the following   symptoms occur over the next 10 days:  You have a temperature by mouth above 102 F (38.9 C), not controlled by medicine.  Shortness of breath.  Weakness after normal activity.  The white part of the eye turns yellow (jaundice).  You have a decrease in the amount of urine or are urinating less often.  Your  urine turns a dark color or changes to pink, red, or brown. Document Released: 11/24/2000 Document Revised: 02/19/2012 Document Reviewed: 07/13/2008 Exit[employer] Patient Information 2014 [employer].  

## 2013-11-01 ENCOUNTER — Encounter (HOSPITAL_COMMUNITY): Payer: Self-pay

## 2013-11-02 LAB — TYPE AND SCREEN
Antibody Screen: NEGATIVE
Unit division: 0

## 2013-11-10 ENCOUNTER — Ambulatory Visit (HOSPITAL_COMMUNITY)
Admission: RE | Admit: 2013-11-10 | Discharge: 2013-11-10 | Disposition: A | Discharge status: Home / Self Care | Payer: Medicare Other | Source: Ambulatory Visit | Attending: Oncology | Admitting: Oncology

## 2013-11-10 DIAGNOSIS — D539 Nutritional anemia, unspecified: Secondary | ICD-10-CM

## 2013-11-11 ENCOUNTER — Other Ambulatory Visit: Payer: Self-pay | Admitting: Radiation Oncology

## 2013-11-11 DIAGNOSIS — C61 Malignant neoplasm of prostate: Secondary | ICD-10-CM

## 2013-11-11 DIAGNOSIS — C7951 Secondary malignant neoplasm of bone: Secondary | ICD-10-CM

## 2013-11-15 ENCOUNTER — Encounter (HOSPITAL_COMMUNITY): Payer: Self-pay | Admitting: Emergency Medicine

## 2013-11-15 ENCOUNTER — Emergency Department (HOSPITAL_COMMUNITY)
Admission: EM | Admit: 2013-11-15 | Discharge: 2013-11-15 | Disposition: A | Discharge status: Home / Self Care | Payer: Medicare Other | Attending: Emergency Medicine | Admitting: Emergency Medicine

## 2013-11-15 ENCOUNTER — Emergency Department (HOSPITAL_COMMUNITY): Payer: Medicare Other

## 2013-11-15 DIAGNOSIS — M25511 Pain in right shoulder: Secondary | ICD-10-CM

## 2013-11-15 DIAGNOSIS — Z8639 Personal history of other endocrine, nutritional and metabolic disease: Secondary | ICD-10-CM | POA: Insufficient documentation

## 2013-11-15 DIAGNOSIS — M25519 Pain in unspecified shoulder: Secondary | ICD-10-CM | POA: Insufficient documentation

## 2013-11-15 DIAGNOSIS — J45909 Unspecified asthma, uncomplicated: Secondary | ICD-10-CM | POA: Insufficient documentation

## 2013-11-15 DIAGNOSIS — M549 Dorsalgia, unspecified: Secondary | ICD-10-CM | POA: Insufficient documentation

## 2013-11-15 DIAGNOSIS — F17200 Nicotine dependence, unspecified, uncomplicated: Secondary | ICD-10-CM | POA: Insufficient documentation

## 2013-11-15 DIAGNOSIS — C61 Malignant neoplasm of prostate: Secondary | ICD-10-CM | POA: Insufficient documentation

## 2013-11-15 DIAGNOSIS — Z862 Personal history of diseases of the blood and blood-forming organs and certain disorders involving the immune mechanism: Secondary | ICD-10-CM | POA: Insufficient documentation

## 2013-11-15 DIAGNOSIS — Z79899 Other long term (current) drug therapy: Secondary | ICD-10-CM | POA: Insufficient documentation

## 2013-11-15 DIAGNOSIS — C7951 Secondary malignant neoplasm of bone: Secondary | ICD-10-CM | POA: Insufficient documentation

## 2013-11-15 MED ORDER — HYDROMORPHONE HCL PF 1 MG/ML IJ SOLN
1.0000 mg | Freq: Once | INTRAMUSCULAR | Status: AC
  Administered 2013-11-15: 1 mg via INTRAMUSCULAR
  Filled 2013-11-15: qty 1

## 2013-11-15 NOTE — ED Notes (Signed)
Pt presents with a chief complaint of pain in the right shoulder that radiates to the mid back. Pt reports pain in the area for three weeks, however fell two weeks ago and reports worsening pain in the area. Pt is in treatment for prostate cancer and his oncologist instructed him to take two of his pain pills, with minimal relief. Pt was instructed to present to ED due to persistent pain in the area for a possible x-ray.

## 2013-11-15 NOTE — ED Provider Notes (Signed)
CSN: [id number]     Arrival date & time 11/15/13  1505 History   First MD Initiated Contact with Patient 11/15/13 1611     Chief Complaint  Patient presents with  . Shoulder Pain  . Back Pain   (Consider location/radiation/quality/duration/timing/severity/associated sxs/prior Treatment) HPI Comments: 79 year old male with history of prostate cancer currently on radiation therapy presents with 3 weeks of right scapular and axillary pain. He states his pain seems to be mostly constant. Nothing makes pain better or worse, including breathing, movement of right shoulder, and sitting or standing. He's been taking his 5 mg oxycodone but it wasn't helping. Today he doubled the dose with some relief. His 10 to a 5. Denies any shortness of breath, numbness, or weakness. Patient called his oncology office who recommended coming to the ER for x-rays. No rash has been noted.   Past Medical History  Diagnosis Date  . Allergy   . Hyperlipidemia   . Prostate cancer     with bone mets  . S/P radiation therapy   . S/P chemotherapy, time since greater than 12 weeks   . Asthma   . Bone metastasis 2013    from prostate primary   Past Surgical History  Procedure Laterality Date  . Cholecystectomy    . Cataract extraction    . Prostate biopsy  2001  . Perineal prostatectomy  2001   Family History  Problem Relation Age of Onset  . Prostate cancer Neg Hx   . Cancer Neg Hx    History  Substance Use Topics  . Smoking status: Current Every Day Smoker -- 0.50 packs/day for 40 years    Types: Cigarettes  . Smokeless tobacco: Never Used  . Alcohol Use: 0.5 oz/week    1 drink(s) per week     Comment: reports drinking one alcoholic beverage daily    Review of Systems  Constitutional: Negative for fever and chills.  Respiratory: Negative for shortness of breath.   Cardiovascular: Negative for chest pain.  Gastrointestinal: Negative for vomiting and abdominal pain.  Musculoskeletal: Positive for  back pain.  Skin: Negative for rash and wound.  Neurological: Negative for weakness and numbness.  All other systems reviewed and are negative.    Allergies  Review of patient's allergies indicates no known allergies.  Home Medications   Current Outpatient Rx  Name  Route  Sig  Dispense  Refill  . calcium carbonate (OS-CAL) 600 MG TABS   Oral   Take 600 mg by mouth 2 (two) times daily with a meal.         . gabapentin (NEURONTIN) 100 MG capsule   Oral   Take 100 mg by mouth 3 (three) times daily.         Marland Kitchen oxycodone (OXY-IR) 5 MG capsule   Oral   Take 5 mg by mouth every 4 (four) hours as needed for pain.          BP 122/71  Pulse 101  Temp(Src) 97.6 F (36.4 C) (Oral)  Resp 18  SpO2 100% Physical Exam  Nursing note and vitals reviewed. Constitutional: He is oriented to person, place, and time. He appears well-developed and well-nourished. No distress.  HENT:  Head: Normocephalic and atraumatic.  Right Ear: External ear normal.  Left Ear: External ear normal.  Nose: Nose normal.  Eyes: Right eye exhibits no discharge. Left eye exhibits no discharge.  Neck: Neck supple.  Cardiovascular: Normal rate, regular rhythm, normal heart sounds and intact distal pulses.  Pulmonary/Chest: Effort normal and breath sounds normal.  Abdominal: Soft. He exhibits no distension. There is no tenderness.  Musculoskeletal:       Right shoulder: He exhibits normal range of motion and no tenderness.  Mild right scapular tenderness. Mild axillary tenderness. No lymphadenopathy appreciated  Neurological: He is alert and oriented to person, place, and time. He has normal strength. No sensory deficit. He exhibits normal muscle tone.  Skin: Skin is warm and dry.    ED Course  Procedures (including critical care time) Labs Review Labs Reviewed - No data to display Imaging Review Dg Chest 2 View  11/15/2013   CLINICAL DATA:  Anterior right chest pain and scapular pain status post a  fall approximately 2 weeks ago.  EXAM: CHEST  2 VIEW  COMPARISON:  Chest x-ray dated December 15, 2012.  FINDINGS: The lungs are adequately inflated. There is no focal infiltrate. There are coarse lung markings in the left lower lobe posteriorly which are not entirely new. The cardiac silhouette is normal in size. The pulmonary vascularity is not engorged. The mediastinum is normal in width. There is mild tortuosity of the descending thoracic aorta. There is no evidence of a pneumothorax nor pneumomediastinum. There is degenerative disc change at multiple thoracic levels and there is sclerosis of multiple thoracic vertebral bodies due to known metastatic prostate malignancy. No definite acute rib fracture is demonstrated.  IMPRESSION: 1. There is no evidence of a pulmonary contusion or pneumonia nor CHF. 2. As best as can be determined no acute rib fracture is present. 3. The observed portions of the right scapula appear normal. There are sclerotic foci within the humeral head consistent with metastatic disease. There are findings consistent with metastatic disease to the spine.   Electronically Signed   By: David  [place]   On: 11/15/2013 17:00   Dg Scapula Right  11/15/2013   CLINICAL DATA:  History of metastatic prostate malignancy and pain since recent fall  EXAM: RIGHT SCAPULA - 2+ VIEWS  COMPARISON:  None.  FINDINGS: The scapula appears reasonably well mineralized. There is a sclerotic focus at the base of the coracoid measuring approximately 4 x 5 mm. There are sclerotic foci within the humeral head. The clavicle appears intact.  IMPRESSION: There is no acute fracture demonstrated. There are findings consistent with metastatic disease to the scapula and humeral head.   Electronically Signed   By: David  [place]   On: 11/15/2013 17:04    EKG Interpretation   None       MDM   1. Right shoulder pain   2. Bone metastasis    New lesion seen on Xray that is likely the cause of his shoulder/scapular  pain. There is no rash or obvious acute trauma or swelling to suggest another cause. Is NV intact. Discussed with oncology on call who recommends patient take pain meds more regularly (he takes his oxycodone infrequently and only when pain becomes severe) and to follow up with his oncologist after the weekend. Given the lesion and mechanical sx, atypical ACS or other atypical presentation is felt to be less likely.     Audree Camel, MD 11/16/13 [phone]

## 2013-11-17 ENCOUNTER — Telehealth: Payer: Self-pay | Admitting: *Deleted

## 2013-11-17 NOTE — Telephone Encounter (Addendum)
PT. HAS TAKEN A PAIN PILL AND IS ASLEEP. MRS. Latouche WANTED DR. SHADAD AWARE THAT PT. WAS SEEN IN THE [facility] ROOM ON Saturday DUE TO HIS SHOULDER PAIN. THE PT. WAITS UNTIL HIS PAIN IS "INTENSE" [PT.'S WIFE STATES A TEN] BEFORE HE WILL TAKE HIS PAIN PILL. HIS PAIN IS REDUCED TO A FOUR. RARELY PT. HAS TAKEN TWO PAIN PILLS. HE IS VERY FATIGUE. THE MEGESTROL DOES NOT HELP PT.'S APPETITE. FLUID INTAKE IS LOW. HE DOES TAKE TWO ENSURE A DAY.  NOTIFIED PT'S WIFE THAT DR.SHADAD IS OUT OF THE OFFICE. WILL LEAVE THIS NOTE CONCERNING PT.'S EMERGENCY ROOM VISIT IN DR.SHADAD'S ACTIVE WORK FOLDER TO VIEW TOMORROW. ENCOURAGED MRS.Montilla TO HAVE PT. TO FORCE FLUIDS, EAT SMALL FREQUENT SNACKS, AND TO TAKE HIS PAIN MEDICATION AS NEEDED. SHE VOICES UNDERSTANDING.

## 2013-11-19 ENCOUNTER — Telehealth: Payer: Self-pay | Admitting: *Deleted

## 2013-11-19 NOTE — Telephone Encounter (Signed)
Wife louise calling to report that patient was out gathering limbs in the yard to burn and fell. A family member had to help him up. Patient states he is ok and doesn't think he needs to go to the E.R.. Note to dr Clelia Croft.

## 2013-11-19 NOTE — Telephone Encounter (Signed)
Wife louise calling to say patient was seen in the E.R. On 11-15-13. Had chest x-ray and scapula x-ray.  No appetite, megace not helping. No energy, difficult to get through ADL. Although he is not taking pain medications regularly.  Spoke with patient and encouraged him to keep his nuclear med appt and lab/mid level/ and possible blood transfusion appt scheduled for next week. 11/28/13. He is to call back if he thinks he needs to be seen sooner. Note to dr Alver Fisher desk

## 2013-11-20 ENCOUNTER — Telehealth: Payer: Self-pay | Admitting: *Deleted

## 2013-11-20 ENCOUNTER — Other Ambulatory Visit: Payer: Self-pay | Admitting: Oncology

## 2013-11-20 DIAGNOSIS — D539 Nutritional anemia, unspecified: Secondary | ICD-10-CM

## 2013-11-20 NOTE — Telephone Encounter (Signed)
Spoke with wife, gave appt for 11/21/13 for 9:00 am lab for type and cross, 10:00 am for blood transfusion. Dr Clelia Croft will see patient in the infusion room. Wife verbalized understanding.

## 2013-11-20 NOTE — Telephone Encounter (Signed)
Per POF I have scheduled appt 

## 2013-11-21 ENCOUNTER — Ambulatory Visit (HOSPITAL_BASED_OUTPATIENT_CLINIC_OR_DEPARTMENT_OTHER): Payer: Medicare Other

## 2013-11-21 ENCOUNTER — Other Ambulatory Visit (HOSPITAL_BASED_OUTPATIENT_CLINIC_OR_DEPARTMENT_OTHER): Payer: Medicare Other

## 2013-11-21 ENCOUNTER — Other Ambulatory Visit: Payer: Self-pay | Admitting: Oncology

## 2013-11-21 VITALS — BP 101/64 | HR 98 | Temp 97.8°F | Resp 20 | Ht 66.0 in

## 2013-11-21 DIAGNOSIS — D6481 Anemia due to antineoplastic chemotherapy: Secondary | ICD-10-CM

## 2013-11-21 DIAGNOSIS — D539 Nutritional anemia, unspecified: Secondary | ICD-10-CM

## 2013-11-21 DIAGNOSIS — C61 Malignant neoplasm of prostate: Secondary | ICD-10-CM

## 2013-11-21 LAB — CBC & DIFF AND RETIC
BASO%: 0.6 % (ref 0.0–2.0)
Basophils Absolute: 0 10*3/uL (ref 0.0–0.1)
Eosinophils Absolute: 0.1 10*3/uL (ref 0.0–0.5)
HGB: 7.6 g/dL — ABNORMAL LOW (ref 13.0–17.1)
Immature Retic Fract: 18.6 % — ABNORMAL HIGH (ref 3.00–10.60)
MCV: 82.3 fL (ref 79.3–98.0)
MONO#: 0.2 10*3/uL (ref 0.1–0.9)
MONO%: 7.7 % (ref 0.0–14.0)
NEUT#: 2.1 10*3/uL (ref 1.5–6.5)
RBC: 2.93 10*6/uL — ABNORMAL LOW (ref 4.20–5.82)
RDW: 19.4 % — ABNORMAL HIGH (ref 11.0–14.6)
Retic %: 2.15 % — ABNORMAL HIGH (ref 0.80–1.80)
Retic Ct Abs: 63 10*3/uL (ref 34.80–93.90)

## 2013-11-21 LAB — PREPARE RBC (CROSSMATCH)

## 2013-11-21 MED ORDER — DIPHENHYDRAMINE HCL 25 MG PO CAPS
25.0000 mg | ORAL_CAPSULE | Freq: Once | ORAL | Status: AC
  Administered 2013-11-21: 25 mg via ORAL

## 2013-11-21 MED ORDER — ACETAMINOPHEN 325 MG PO TABS
650.0000 mg | ORAL_TABLET | Freq: Once | ORAL | Status: AC
  Administered 2013-11-21: 650 mg via ORAL

## 2013-11-21 MED ORDER — OXYCODONE HCL 5 MG PO TABS: 5.0000 mg | ORAL_TABLET | ORAL | Status: DC | PRN

## 2013-11-21 MED ORDER — ACETAMINOPHEN 325 MG PO TABS
ORAL_TABLET | ORAL | Status: AC
  Filled 2013-11-21: qty 2

## 2013-11-21 MED ORDER — DIPHENHYDRAMINE HCL 25 MG PO CAPS
ORAL_CAPSULE | ORAL | Status: AC
  Filled 2013-11-21: qty 2

## 2013-11-21 MED ORDER — SODIUM CHLORIDE 0.9 % IJ SOLN
10.0000 mL | INTRAMUSCULAR | Status: DC | PRN
  Filled 2013-11-21: qty 10

## 2013-11-21 MED ORDER — HEPARIN SOD (PORK) LOCK FLUSH 100 UNIT/ML IV SOLN
500.0000 [IU] | Freq: Every day | INTRAVENOUS | Status: DC | PRN
  Filled 2013-11-21: qty 5

## 2013-11-21 MED ORDER — SENNA-DOCUSATE SODIUM 8.6-50 MG PO TABS: 2.0000 | ORAL_TABLET | Freq: Every day | ORAL | Status: AC

## 2013-11-21 MED ORDER — SODIUM CHLORIDE 0.9 % IV SOLN
250.0000 mL | Freq: Once | INTRAVENOUS | Status: AC
  Administered 2013-11-21: 250 mL via INTRAVENOUS

## 2013-11-21 NOTE — Patient Instructions (Signed)
Blood Transfusion  A blood transfusion replaces your blood or some of its parts. Blood is replaced when you have lost blood because of surgery, an accident, or for severe blood conditions like anemia. You can donate blood to be used on yourself if you have a planned surgery. If you lose blood during that surgery, your own blood can be given back to you. Any blood given to you is checked to make sure it matches your blood type. Your temperature, blood pressure, and heart rate (vital signs) will be checked often.  GET HELP RIGHT AWAY IF:   You feel sick to your stomach (nauseous) or throw up (vomit).  You have watery poop (diarrhea).  You have shortness of breath or trouble breathing.  You have blood in your pee (urine) or have dark colored pee.  You have chest pain or tightness.  Your eyes or skin turn yellow (jaundice).  You have a temperature by mouth above 102 F (38.9 C), not controlled by medicine.  You start to shake and have chills.  You develop a a red rash (hives) or feel itchy.  You develop lightheadedness or feel confused.  You develop back, joint, or muscle pain.  You do not feel hungry (lost appetite).  You feel tired, restless, or nervous.  You develop belly (abdominal) cramps. Document Released: 02/23/2009 Document Revised: 02/19/2012 Document Reviewed: 02/23/2009 Exit[employer] Patient Information 2014 [employer].  

## 2013-11-23 LAB — TYPE AND SCREEN
ABO/RH(D): O NEG
Antibody Screen: NEGATIVE
Unit division: 0

## 2013-11-25 ENCOUNTER — Telehealth: Payer: Self-pay | Admitting: *Deleted

## 2013-11-25 NOTE — Telephone Encounter (Signed)
Called patient to remind of xofigo injection for 11-27-13, spoke with patient and he is aware of this appt.

## 2013-11-27 ENCOUNTER — Encounter: Payer: Self-pay | Admitting: Radiation Oncology

## 2013-11-27 ENCOUNTER — Encounter (HOSPITAL_COMMUNITY)
Admission: RE | Admit: 2013-11-27 | Discharge: 2013-11-27 | Disposition: A | Discharge status: Home / Self Care | Payer: Medicare Other | Source: Ambulatory Visit | Attending: Radiation Oncology | Admitting: Radiation Oncology

## 2013-11-27 DIAGNOSIS — C7951 Secondary malignant neoplasm of bone: Secondary | ICD-10-CM | POA: Insufficient documentation

## 2013-11-27 DIAGNOSIS — C61 Malignant neoplasm of prostate: Secondary | ICD-10-CM | POA: Insufficient documentation

## 2013-11-27 DIAGNOSIS — C801 Malignant (primary) neoplasm, unspecified: Secondary | ICD-10-CM | POA: Insufficient documentation

## 2013-11-27 NOTE — Progress Notes (Signed)
  Radiation Oncology         [phone] ________________________________  Name: Aaron Schaefer MRN: [id number]  Date: 11/27/2013  DOB: Dec 24, 1932  Radium-[facility] Infusion Note  Diagnosis:  Castration resistant prostate cancer with painful bone involvement  Current Infusion:    3  Planned Infusions:  6  Narrative: [name] presented to nuclear medicine for treatment. His most recent blood counts were reviewed. He will receive a blood transfusion later this week for his anemia.  He remains a good candidate to proceed with Ra-223.  The patient was situated in an infusion suite with a contact barrier placed under his arm. Intravenous access was established, using sterile technique, and a normal saline infusion from a syringe was started.  Micro-dosimetry:  The prescribed radiation activity was assayed and confirmed to be within specified tolerance.  Special Treatment Procedure - Infusion:  The nuclear medicine technologist and I personally verified the dose activity to be delivered as specified in the written directive, and verified the patient identification via 2 separate methods.  The syringe containing the dose was attached to a 3 way stopcock, and then the valve was opened to the patient, and the dose delivered over a minute. No complications were noted.  The total administered dose was 94.6 microcuries .  A saline flush of the line and the syringe that contained the isotope was then performed.  The entire IV tubing, venocatheter, stopcock and syringes was removed in total, placed in a disposal bag and sent for assay of the residual activity, which will be reported at a later time in our EMR by the physics staff.  Pressure was applied to the venipuncture site, and a compression bandage placed.  Radiation Safety personnel were present to perform the discharge survey, as detailed on their documentation.   After a short period of observation, the patient had his IV removed.  Impression:   The patient tolerated his infusion relatively well.  Plan:  The patient will return in one month for ongoing care.    -----------------------------------  Billie Lade, PhD, MD  Addendum:  The residual radioactivity reported later by was Radiation Safety was 3.0 microcuries, so the actual infused isotope activity was 91.6 microcuries.

## 2013-11-28 ENCOUNTER — Encounter: Payer: Self-pay | Admitting: Oncology

## 2013-11-28 ENCOUNTER — Telehealth: Payer: Self-pay | Admitting: *Deleted

## 2013-11-28 ENCOUNTER — Other Ambulatory Visit (HOSPITAL_BASED_OUTPATIENT_CLINIC_OR_DEPARTMENT_OTHER): Payer: Medicare Other

## 2013-11-28 ENCOUNTER — Ambulatory Visit (HOSPITAL_BASED_OUTPATIENT_CLINIC_OR_DEPARTMENT_OTHER): Payer: Medicare Other | Admitting: Oncology

## 2013-11-28 ENCOUNTER — Telehealth: Payer: Self-pay | Admitting: Oncology

## 2013-11-28 VITALS — BP 121/57 | HR 113 | Temp 94.8°F | Ht 66.0 in | Wt 142.5 lb

## 2013-11-28 DIAGNOSIS — R0609 Other forms of dyspnea: Secondary | ICD-10-CM

## 2013-11-28 DIAGNOSIS — D649 Anemia, unspecified: Secondary | ICD-10-CM

## 2013-11-28 DIAGNOSIS — D539 Nutritional anemia, unspecified: Secondary | ICD-10-CM

## 2013-11-28 DIAGNOSIS — C7951 Secondary malignant neoplasm of bone: Secondary | ICD-10-CM

## 2013-11-28 DIAGNOSIS — C61 Malignant neoplasm of prostate: Secondary | ICD-10-CM

## 2013-11-28 DIAGNOSIS — R0989 Other specified symptoms and signs involving the circulatory and respiratory systems: Secondary | ICD-10-CM

## 2013-11-28 LAB — COMPREHENSIVE METABOLIC PANEL (CC13)
Albumin: 2.8 g/dL — ABNORMAL LOW (ref 3.5–5.0)
Alkaline Phosphatase: 144 U/L (ref 40–150)
Anion Gap: 13 mEq/L — ABNORMAL HIGH (ref 3–11)
BUN: 21.1 mg/dL (ref 7.0–26.0)
CO2: 20 mEq/L — ABNORMAL LOW (ref 22–29)
Glucose: 130 mg/dl (ref 70–140)
Potassium: 4 mEq/L (ref 3.5–5.1)
Total Bilirubin: 0.86 mg/dL (ref 0.20–1.20)

## 2013-11-28 LAB — CBC WITH DIFFERENTIAL/PLATELET
BASO%: 0.8 % (ref 0.0–2.0)
Basophils Absolute: 0 10*3/uL (ref 0.0–0.1)
EOS%: 1.7 % (ref 0.0–7.0)
Eosinophils Absolute: 0.1 10*3/uL (ref 0.0–0.5)
HCT: 30.9 % — ABNORMAL LOW (ref 38.4–49.9)
HGB: 9.9 g/dL — ABNORMAL LOW (ref 13.0–17.1)
LYMPH%: 19.5 % (ref 14.0–49.0)
MCH: 26.8 pg — ABNORMAL LOW (ref 27.2–33.4)
MCHC: 32 g/dL (ref 32.0–36.0)
MCV: 83.7 fL (ref 79.3–98.0)
MONO#: 0.3 10*3/uL (ref 0.1–0.9)
NEUT#: 2.6 10*3/uL (ref 1.5–6.5)
NEUT%: 71 % (ref 39.0–75.0)
Platelets: 106 10*3/uL — ABNORMAL LOW (ref 140–400)
nRBC: 0 % (ref 0–0)

## 2013-11-28 LAB — PSA: PSA: 1191 ng/mL — ABNORMAL HIGH (ref <=4.00)

## 2013-11-28 NOTE — Addendum Note (Signed)
Addended by: Benjiman Core on: 11/28/2013 03:08 PM   Modules accepted: Level of Service

## 2013-11-28 NOTE — Telephone Encounter (Signed)
Gave pt appt for lab and MD, emailed Marcelino Duster regarding PRBC

## 2013-11-28 NOTE — Telephone Encounter (Signed)
Per staff message and POF I have scheduled appts.  JMW  

## 2013-11-28 NOTE — Progress Notes (Signed)
Hematology and Oncology Follow Up Visit  Aaron Schaefer [id number] 10-Oct-1933 79 y.o. 11/28/2013 2:38 PM Aaron Schaefer Aaron Schaefer, [name], [name],*   Principle Diagnosis: 79 year old with castration resistant prostate cancer. Initial diagnosis was in 2001. Gleason score is 3+4 = 7. PSA was 4.96.  Prior Therapy:  1. S/P prostateectomy followed by radiation therapy at the time of diagnosis. 2. Started on degarelix on 11/21/10 due to Schaefer rising PSA. This was switched to Lupron in March 2012 due to pain at the injection site. 3. Aaron developed bone mets at T3, T4, & T5 and was started on Casodex and Xgeva on 12/22/11. 4. Received Provenge 02/15/12 through 03/14/12. 5. Aaron received Xtandi from June 2013 to September 2013. 6. S/P Systemic chemotherapy with Taxotere started on 09/16/12. S/P 5 cycles. Treatment stopped in 12/2012 due to poor tolerance.  7.  Zytiga 1000 mg daily on from 03/03/13 to 07/2013. This was stopped due to progression of disease.   Current therapy:  Aaron remains on Lupron and Xgeva at [facility]. Aaron is on Xofigo with three treatments given so far.    Interim History:  Aaron Schaefer is seen today for routine follow-up. Since the last visit, Aaron is feeling very weak and lethargic. 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 Aaron plans to take Schaefer break for Schaefer few weeks. Aaron still has lower extremity edema which is improving and Aaron is able to wear shoes better.  No SOB or dyspnea. Aaron is reporting less diffuse bone pain in his shoulders and hips. Aaron takes Oxycodone intermittently. States that Aaron does not take it every day. Appetite is decreased and Aaron has lost 6 lbs in 1 month. Aaron does not have any localizing symptoms or any neurological deficits. Aaron received 2 units PRBC last week with improvement in his fatigue. Denies bleeding.  Medications: I have reviewed the patient's current  medications.   Current Outpatient Prescriptions  Medication Sig Dispense Refill  . calcium carbonate (OS-CAL) 600 MG TABS Take 600 mg by mouth 2 (two) times daily with Schaefer meal.      . gabapentin (NEURONTIN) 100 MG capsule Take 100 mg by mouth 3 (three) times daily.      . megestrol (MEGACE) 40 MG/ML suspension       . oxyCODONE (ROXICODONE) 5 MG immediate release tablet Take 1 tablet (5 mg total) by mouth every 4 (four) hours as needed for severe pain.  30 tablet  0  . sennosides-docusate sodium (SENOKOT-S) 8.6-50 MG tablet Take 2 tablets by mouth daily.  60 tablet  3   No current facility-administered medications for this visit.    Allergies: No Known Allergies  Past Medical History, Surgical history, Social history, and Family History were reviewed and updated.  Review of Systems: Remaining ROS negative.  Physical Exam: Blood pressure 121/57, pulse 113, temperature 94.8 F (34.9 C), temperature source Oral, height 5\' 6"  (1.676 m), weight 142 lb 8 oz (64.638 kg). ECOG: 1  General appearance: alert, cooperative and no distress Head: Normocephalic, without obvious abnormality, atraumatic Neck: no adenopathy, no carotid bruit, no JVD, supple, symmetrical, trachea midline and thyroid not enlarged, symmetric, no tenderness/mass/nodules Lymph nodes: Cervical, supraclavicular, and axillary nodes normal. Heart:regular rate and rhythm, S1, S2 normal, no murmur, click, rub or gallop Lung:chest clear, no wheezing, rales, normal symmetric air entry, no tachypnea, retractions or cyanosis Abdomen:  soft, non-tender, without masses or organomegaly EXT:no erythema, induration, or nodules. Trace edema to the BLE. Neuro: no deficits.   Lab Results: Lab Results  Component Value Date   WBC 3.6* 11/28/2013   HGB 9.9* 11/28/2013   HCT 30.9* 11/28/2013   MCV 83.7 11/28/2013   PLT 106* 11/28/2013     Chemistry      Component Value Date/Time   NA 139 11/28/2013 0819   NA 140 12/15/2012 1353   K  4.0 11/28/2013 0819   K 3.9 12/15/2012 1353   CL 105 05/08/2013 0856   CL 104 12/15/2012 1353   CO2 20* 11/28/2013 0819   CO2 26 12/15/2012 1353   BUN 21.1 11/28/2013 0819   BUN 17 12/15/2012 1353   CREATININE 0.8 11/28/2013 0819   CREATININE 0.87 12/15/2012 1353      Component Value Date/Time   CALCIUM 9.1 11/28/2013 0819   CALCIUM 8.3* 12/15/2012 1353   ALKPHOS 144 11/28/2013 0819   ALKPHOS 102 12/15/2012 1353   AST 33 11/28/2013 0819   AST 18 12/15/2012 1353   ALT 23 11/28/2013 0819   ALT 9 12/15/2012 1353   BILITOT 0.86 11/28/2013 0819   BILITOT 1.2 12/15/2012 1353      Results for Aaron Schaefer, Aaron Schaefer (MRN [id number]) as of 11/28/2013 08:48  Ref. Range 05/08/2013 08:56 06/12/2013 08:22 07/17/2013 08:22 08/19/2013 14:45 10/01/2013 08:56  PSA Latest Range: <=4.00 ng/mL 123.20 (H) 226.20 (H) 298.40 (H) 636.40 (H) 769.90 (H)     Impression and Plan: This is Schaefer 79 year old gentleman with the following issues:  1. Castration resistant prostate cancer. Aaron is status post multiple treatments as mentioned above and recently progressed on Zytiga. Aaron is on Xofigo and has been well tolerated.   2. Protein calorie malnutrition. Remains on Megace. Aaron has continued weight loss.n Encouraged him to drink 2-3 cans of Ensure daily.   4. Bony disease. Aaron remains on Xgeva monthly at [facility].  5. Androgen deprivation. Aaron remains on Lupron at [facility].  6. Anemia. Likely multifactorial at this time.Hgb is 9.9 and we will hold off on transfusing. Will recheck CBC in 2 weeks with possible transfusion.    7. LE edema. Improved at this time  8. Bone pain: Using Oxycodone as needed.   9. Follow-up. In 4 weeks.      Aaron Schaefer 12/19/20142:38 PM  Patient seen and examined today personally. Aaron appeared more energetic and has less fatigue since the last time I saw him. His examination today still revealed Schaefer pale gentleman but did not appear in any active distress. His vitals and laboratory data were  reviewed today and seems to have improved with the blood transfusion.  I do not recommend any blood transfusion today and will continue supportive management as we have done in the past. I continue to discuss with him the overall picture of the treatment of his prostate cancer and the fact that if this current treatment doesn't work Aaron might require hospice care.  [facility] 11/28/2013

## 2013-12-01 ENCOUNTER — Other Ambulatory Visit: Payer: Self-pay | Admitting: *Deleted

## 2013-12-01 DIAGNOSIS — D649 Anemia, unspecified: Secondary | ICD-10-CM

## 2013-12-01 DIAGNOSIS — C61 Malignant neoplasm of prostate: Secondary | ICD-10-CM

## 2013-12-01 MED ORDER — GABAPENTIN 100 MG PO CAPS: 100.0000 mg | ORAL_CAPSULE | Freq: Three times a day (TID) | ORAL | Status: AC

## 2013-12-01 MED ORDER — MEGESTROL ACETATE 40 MG/ML PO SUSP: 200.0000 mg | Freq: Two times a day (BID) | ORAL | Status: AC

## 2013-12-01 NOTE — Telephone Encounter (Signed)
Wife louise calling to say patient is having increased pain in his leg, difficulty with ADL. Also states he does not take his pain medication regularly. Also requests refill on mdgace and neurontin. Okay for refill on both per  Belenda Cruise curcio NP. ecouraged to take pain medication ATC for discomfort. Wife verbalizes understanding.

## 2013-12-03 ENCOUNTER — Telehealth: Payer: Self-pay | Admitting: Oncology

## 2013-12-03 NOTE — Telephone Encounter (Signed)
Gave pt's wife appt for lab and PRBC for January 2015

## 2013-12-05 ENCOUNTER — Telehealth: Payer: Self-pay | Admitting: Dietician

## 2013-12-05 NOTE — Telephone Encounter (Signed)
Brief Outpatient Oncology Nutrition Note   Wt Readings from Last 10 Encounters:  11/28/13 142 lb 8 oz (64.638 kg)  10/31/13 150 lb 12.8 oz (68.402 kg)  10/30/13 148 lb 11.2 oz (67.45 kg)  10/01/13 156 lb (70.761 kg)  08/19/13 157 lb 14.4 oz (71.623 kg)  08/13/13 153 lb 9.6 oz (69.673 kg)  07/29/13 163 lb 14.4 oz (74.345 kg)  07/17/13 169 lb 14.4 oz (77.066 kg)  06/12/13 175 lb 1.6 oz (79.425 kg)  05/08/13 175 lb 3.2 oz (79.47 kg)   Call made to patient secondary to discussion with Dr. Julien Girt.  Patient with decreased appetite and wife with questions regarding nutrition.  Wife is out of town.  Spoke with patient's son.  Patient with decreased appetite and taste alterations.  Son just refilled his megace prescription and he got his first dose this am.  Concerns re: patient's continued weight loss.  Discussed tips to increase nutritional intake.  Several small meals per day of high calorie high protein options.  Increase Ensure to Ensure Plus or Complete and take 2-3 cans daily.  Gave options to increase calories and make Ensure more palatable.    [facility] about 14 months ago.  Encouraged follow up with her again as desired.  Gave son her number to call if any questions or for an appointment.  Will mail patient tips for low appetite and taste alterations, along with [facility]'s contact information, an Ensure recipe book for other high calorie high protein options and coupons for Ensure.  Oran Rein, RD, LDN Clinical Inpatient Dietitian Pager:  [phone] Weekend and after hours pager:  [phone]

## 2013-12-08 ENCOUNTER — Telehealth: Payer: Self-pay | Admitting: *Deleted

## 2013-12-08 NOTE — Telephone Encounter (Signed)
Wife Loiuse called asking if they can stop using the Gabapentin 100 mg tid.  Can this be discontinued because "He is not having any pain in his feet or hands and for the past three weeks the medicine is knocking him out.  This is a major change that has occurred.  He has increased drowsiness and does not want to get out of bed and almost bed-ridden.  Has had a couple of falls before Christmas I told Dixie about.  He also is not eating." Also takes oxycodone 5 mg.  Wife says it may be a combination of the two.  Denies any other symptoms.  He is able to talk and express himself. Follows commands using extremities and able to grip hands as in a hand shake.  Last BM was yesterday and takes softeners twice a day.  Sallye Ober can be reached at [phone] and is aware that Dr. Clelia Croft will return to office tomorrow.

## 2013-12-09 ENCOUNTER — Telehealth: Payer: Self-pay | Admitting: *Deleted

## 2013-12-09 ENCOUNTER — Encounter: Payer: Self-pay | Admitting: Dietician

## 2013-12-09 NOTE — Telephone Encounter (Signed)
Spoke with patient's son Caedin Slavens jr, okay to stop the gabapentin, per dr Clelia Croft.

## 2013-12-09 NOTE — Progress Notes (Signed)
Nutrition Brief Note  Patient identified on the [facility] Nutrition Screen.   Wt Readings from Last 15 Encounters:  11/28/13 142 lb 8 oz (64.638 kg)  10/31/13 150 lb 12.8 oz (68.402 kg)  10/30/13 148 lb 11.2 oz (67.45 kg)  10/01/13 156 lb (70.761 kg)  08/19/13 157 lb 14.4 oz (71.623 kg)  08/13/13 153 lb 9.6 oz (69.673 kg)  07/29/13 163 lb 14.4 oz (74.345 kg)  07/17/13 169 lb 14.4 oz (77.066 kg)  06/12/13 175 lb 1.6 oz (79.425 kg)  05/08/13 175 lb 3.2 oz (79.47 kg)  04/03/13 178 lb 14.4 oz (81.149 kg)  02/25/13 180 lb 3.2 oz (81.738 kg)  01/21/13 182 lb 9.6 oz (82.827 kg)  12/31/12 181 lb 4.8 oz (82.237 kg)  12/19/12 183 lb 3.2 oz (83.099 kg)   Chart reviewed. Pt was last called by RD on 12/05/13. Diet education completed and educational materials and Ensure coupons were provided to patient.   Agree with previous RD interventions. No further interventions or recommendations at this time.   Pt has contact information of [facility] RD for additional questions or concerns.   Zakaria Sedor A. Mayford Knife, RD, LDN Pager: [phone]

## 2013-12-12 ENCOUNTER — Ambulatory Visit (HOSPITAL_BASED_OUTPATIENT_CLINIC_OR_DEPARTMENT_OTHER): Payer: Medicare Other

## 2013-12-12 ENCOUNTER — Other Ambulatory Visit (HOSPITAL_BASED_OUTPATIENT_CLINIC_OR_DEPARTMENT_OTHER): Payer: Medicare Other

## 2013-12-12 ENCOUNTER — Ambulatory Visit (HOSPITAL_COMMUNITY)
Admission: RE | Admit: 2013-12-12 | Discharge: 2013-12-12 | Disposition: A | Discharge status: Home / Self Care | Payer: Medicare Other | Source: Ambulatory Visit | Attending: Oncology | Admitting: Oncology

## 2013-12-12 ENCOUNTER — Other Ambulatory Visit: Payer: Self-pay | Admitting: Oncology

## 2013-12-12 VITALS — BP 111/68 | HR 101 | Temp 97.8°F

## 2013-12-12 DIAGNOSIS — D649 Anemia, unspecified: Secondary | ICD-10-CM

## 2013-12-12 DIAGNOSIS — C61 Malignant neoplasm of prostate: Secondary | ICD-10-CM

## 2013-12-12 DIAGNOSIS — D539 Nutritional anemia, unspecified: Secondary | ICD-10-CM

## 2013-12-12 LAB — CBC WITH DIFFERENTIAL/PLATELET
BASO%: 0.8 % (ref 0.0–2.0)
Basophils Absolute: 0 10*3/uL (ref 0.0–0.1)
EOS ABS: 0.1 10*3/uL (ref 0.0–0.5)
EOS%: 2 % (ref 0.0–7.0)
HCT: 26.8 % — ABNORMAL LOW (ref 38.4–49.9)
HEMOGLOBIN: 8.6 g/dL — AB (ref 13.0–17.1)
LYMPH#: 0.5 10*3/uL — AB (ref 0.9–3.3)
LYMPH%: 19.9 % (ref 14.0–49.0)
MCH: 26.6 pg — ABNORMAL LOW (ref 27.2–33.4)
MCHC: 32.1 g/dL (ref 32.0–36.0)
MCV: 83 fL (ref 79.3–98.0)
MONO#: 0.2 10*3/uL (ref 0.1–0.9)
MONO%: 8.1 % (ref 0.0–14.0)
NEUT%: 69.2 % (ref 39.0–75.0)
NEUTROS ABS: 1.7 10*3/uL (ref 1.5–6.5)
NRBC: 1 % — AB (ref 0–0)
Platelets: 73 10*3/uL — ABNORMAL LOW (ref 140–400)
RBC: 3.23 10*6/uL — AB (ref 4.20–5.82)
RDW: 17.4 % — AB (ref 11.0–14.6)
WBC: 2.5 10*3/uL — AB (ref 4.0–10.3)

## 2013-12-12 LAB — HOLD TUBE, BLOOD BANK

## 2013-12-12 LAB — PREPARE RBC (CROSSMATCH)

## 2013-12-12 LAB — TECHNOLOGIST REVIEW

## 2013-12-12 MED ORDER — DIPHENHYDRAMINE HCL 25 MG PO CAPS
ORAL_CAPSULE | ORAL | Status: AC
  Filled 2013-12-12: qty 1

## 2013-12-12 MED ORDER — DIPHENHYDRAMINE HCL 25 MG PO CAPS
25.0000 mg | ORAL_CAPSULE | Freq: Once | ORAL | Status: AC
  Administered 2013-12-12: 25 mg via ORAL

## 2013-12-12 MED ORDER — ACETAMINOPHEN 325 MG PO TABS
650.0000 mg | ORAL_TABLET | Freq: Once | ORAL | Status: AC
  Administered 2013-12-12: 650 mg via ORAL

## 2013-12-12 MED ORDER — ACETAMINOPHEN 325 MG PO TABS
ORAL_TABLET | ORAL | Status: AC
  Filled 2013-12-12: qty 2

## 2013-12-12 MED ORDER — SODIUM CHLORIDE 0.9 % IV SOLN
250.0000 mL | Freq: Once | INTRAVENOUS | Status: AC
Start: 2013-12-12 — End: 2013-12-12
  Administered 2013-12-12: 250 mL via INTRAVENOUS

## 2013-12-12 NOTE — Patient Instructions (Signed)
Blood Transfusion  A blood transfusion replaces your blood or some of its parts. Blood is replaced when you have lost blood because of surgery, an accident, or for severe blood conditions like anemia. You can donate blood to be used on yourself if you have a planned surgery. If you lose blood during that surgery, your own blood can be given back to you. Any blood given to you is checked to make sure it matches your blood type. Your temperature, blood pressure, and heart rate (vital signs) will be checked often.  GET HELP RIGHT AWAY IF:   You feel sick to your stomach (nauseous) or throw up (vomit).  You have watery poop (diarrhea).  You have shortness of breath or trouble breathing.  You have blood in your pee (urine) or have dark colored pee.  You have chest pain or tightness.  Your eyes or skin turn yellow (jaundice).  You have a temperature by mouth above 102 F (38.9 C), not controlled by medicine.  You start to shake and have chills.  You develop a a red rash (hives) or feel itchy.  You develop lightheadedness or feel confused.  You develop back, joint, or muscle pain.  You do not feel hungry (lost appetite).  You feel tired, restless, or nervous.  You develop belly (abdominal) cramps. Document Released: 02/23/2009 Document Revised: 02/19/2012 Document Reviewed: 02/23/2009 Exit[employer] Patient Information 2014 [employer].  

## 2013-12-13 LAB — TYPE AND SCREEN
ABO/RH(D): O NEG
Antibody Screen: NEGATIVE
Unit division: 0
Unit division: 0

## 2013-12-19 ENCOUNTER — Other Ambulatory Visit: Payer: Self-pay | Admitting: Radiation Oncology

## 2013-12-19 DIAGNOSIS — C7951 Secondary malignant neoplasm of bone: Secondary | ICD-10-CM

## 2013-12-19 DIAGNOSIS — C61 Malignant neoplasm of prostate: Secondary | ICD-10-CM

## 2013-12-23 ENCOUNTER — Telehealth: Payer: Self-pay | Admitting: *Deleted

## 2013-12-23 NOTE — Telephone Encounter (Signed)
Called patient to inform of xofigo injection on 01-01-14 - arrival time - 11:45 am in Interventional Radiology at [facility], lvm for a return call

## 2013-12-26 ENCOUNTER — Other Ambulatory Visit (HOSPITAL_BASED_OUTPATIENT_CLINIC_OR_DEPARTMENT_OTHER): Payer: Medicare Other

## 2013-12-26 ENCOUNTER — Telehealth: Payer: Self-pay | Admitting: *Deleted

## 2013-12-26 ENCOUNTER — Encounter: Payer: Self-pay | Admitting: Oncology

## 2013-12-26 ENCOUNTER — Ambulatory Visit (HOSPITAL_BASED_OUTPATIENT_CLINIC_OR_DEPARTMENT_OTHER): Payer: Medicare Other | Admitting: Oncology

## 2013-12-26 VITALS — BP 104/70 | HR 123 | Temp 90.6°F | Resp 18 | Ht 66.0 in | Wt 134.9 lb

## 2013-12-26 DIAGNOSIS — E46 Unspecified protein-calorie malnutrition: Secondary | ICD-10-CM

## 2013-12-26 DIAGNOSIS — D649 Anemia, unspecified: Secondary | ICD-10-CM

## 2013-12-26 DIAGNOSIS — R609 Edema, unspecified: Secondary | ICD-10-CM

## 2013-12-26 DIAGNOSIS — C7951 Secondary malignant neoplasm of bone: Secondary | ICD-10-CM

## 2013-12-26 DIAGNOSIS — C7952 Secondary malignant neoplasm of bone marrow: Secondary | ICD-10-CM

## 2013-12-26 DIAGNOSIS — C61 Malignant neoplasm of prostate: Secondary | ICD-10-CM

## 2013-12-26 DIAGNOSIS — M899 Disorder of bone, unspecified: Secondary | ICD-10-CM

## 2013-12-26 DIAGNOSIS — M949 Disorder of cartilage, unspecified: Secondary | ICD-10-CM

## 2013-12-26 DIAGNOSIS — E291 Testicular hypofunction: Secondary | ICD-10-CM

## 2013-12-26 DIAGNOSIS — R634 Abnormal weight loss: Secondary | ICD-10-CM

## 2013-12-26 LAB — CBC WITH DIFFERENTIAL/PLATELET
BASO%: 0.4 % (ref 0.0–2.0)
BASOS ABS: 0 10*3/uL (ref 0.0–0.1)
EOS%: 1.1 % (ref 0.0–7.0)
Eosinophils Absolute: 0 10*3/uL (ref 0.0–0.5)
HCT: 27.1 % — ABNORMAL LOW (ref 38.4–49.9)
HGB: 8.9 g/dL — ABNORMAL LOW (ref 13.0–17.1)
LYMPH%: 16.6 % (ref 14.0–49.0)
MCH: 27.7 pg (ref 27.2–33.4)
MCHC: 32.8 g/dL (ref 32.0–36.0)
MCV: 84.4 fL (ref 79.3–98.0)
MONO#: 0.3 10*3/uL (ref 0.1–0.9)
MONO%: 10.7 % (ref 0.0–14.0)
NEUT#: 1.9 10*3/uL (ref 1.5–6.5)
NEUT%: 71.2 % (ref 39.0–75.0)
Platelets: 66 10*3/uL — ABNORMAL LOW (ref 140–400)
RBC: 3.21 10*6/uL — ABNORMAL LOW (ref 4.20–5.82)
RDW: 16.6 % — AB (ref 11.0–14.6)
WBC: 2.7 10*3/uL — ABNORMAL LOW (ref 4.0–10.3)
lymph#: 0.5 10*3/uL — ABNORMAL LOW (ref 0.9–3.3)

## 2013-12-26 LAB — HOLD TUBE, BLOOD BANK

## 2013-12-26 LAB — COMPREHENSIVE METABOLIC PANEL (CC13)
ALK PHOS: 175 U/L — AB (ref 40–150)
ALT: 11 U/L (ref 0–55)
ANION GAP: 11 meq/L (ref 3–11)
AST: 33 U/L (ref 5–34)
Albumin: 3 g/dL — ABNORMAL LOW (ref 3.5–5.0)
BILIRUBIN TOTAL: 0.92 mg/dL (ref 0.20–1.20)
BUN: 38.7 mg/dL — ABNORMAL HIGH (ref 7.0–26.0)
CO2: 20 mEq/L — ABNORMAL LOW (ref 22–29)
CREATININE: 1 mg/dL (ref 0.7–1.3)
Calcium: 9.6 mg/dL (ref 8.4–10.4)
Chloride: 103 mEq/L (ref 98–109)
GLUCOSE: 129 mg/dL (ref 70–140)
Potassium: 4.9 mEq/L (ref 3.5–5.1)
SODIUM: 135 meq/L — AB (ref 136–145)
Total Protein: 7.4 g/dL (ref 6.4–8.3)

## 2013-12-26 LAB — TECHNOLOGIST REVIEW

## 2013-12-26 LAB — PSA: PSA: 1422 ng/mL — AB (ref <=4.00)

## 2013-12-26 NOTE — Progress Notes (Signed)
Hematology and Oncology Follow Up Visit  Aaron Schaefer [id number] May 10, 1933 80 y.o. 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 Initial diagnosis was in 2001. Gleason score is 3+4 = 7. PSA was 4.96.  Prior Therapy:  1. S/P prostateectomy followed by radiation therapy at the time of diagnosis. 2. Started on degarelix on 11/21/10 due to a rising PSA. This was switched to Lupron in March 2012 due to pain at the injection site. 3. He developed bone mets at T3, T4, & T5 and was started on Casodex and Xgeva on 12/22/11. 4. Received Provenge 02/15/12 through 03/14/12. 5. He received Xtandi from June 2013 to September 2013. 6. S/P Systemic chemotherapy with Taxotere started on 09/16/12. S/P 5 cycles. Treatment stopped in 12/2012 due to poor tolerance.  7.  Zytiga 1000 mg daily on from 03/03/13 to 07/2013. This was stopped due to progression of disease.   Current therapy:  He remains on Lupron and Xgeva at [facility]. He is on Xofigo with three treatments given so far.    Interim History:  Mr Aaron Schaefer is seen today for routine follow-up. Since the last visit, he continues to feel weak and lethargic. He is not reporting exertional dyspnea but still able to get out of the house and work. He still has lower extremity edema which is improving. He is reporting less diffuse bone pain in his shoulders and hips. He takes Oxycodone intermittently. States that he does not take it every day. Appetite is decreased and he has lost more weight. He does not have any localizing symptoms or any neurological deficits.    Medications: I have reviewed the patient's current medications.   Current Outpatient Prescriptions  Medication Sig Dispense Refill  . calcium carbonate (OS-CAL) 600 MG TABS Take 600 mg by mouth 2 (two) times daily with a meal.      . gabapentin (NEURONTIN) 100 MG capsule Take 1 capsule (100 mg total)  by mouth 3 (three) times daily.  90 capsule  1  . megestrol (MEGACE) 40 MG/ML suspension Take 5 mLs (200 mg total) by mouth 2 (two) times daily.  480 mL  1  . Naproxen Sodium (ALEVE PO) Take 220 mg by mouth every 12 (twelve) hours as needed.      Marland Kitchen oxyCODONE (ROXICODONE) 5 MG immediate release tablet Take 1 tablet (5 mg total) by mouth every 4 (four) hours as needed for severe pain.  30 tablet  0  . sennosides-docusate sodium (SENOKOT-S) 8.6-50 MG tablet Take 2 tablets by mouth daily.  60 tablet  3   No current facility-administered medications for this visit.    Allergies: No Known Allergies  Past Medical History, Surgical history, Social history, and Family History were reviewed and updated.  Review of Systems: Remaining ROS negative.  Physical Exam: Blood pressure 104/70, pulse 123, temperature 90.6 F (32.6 C), temperature source Oral, resp. rate 18, height 5\' 6"  (1.676 m), weight 134 lb 14.4 oz (61.19 kg). ECOG: 1  General appearance: alert, cooperative and no distress Head: Normocephalic, without obvious abnormality, atraumatic Neck: no adenopathy, no carotid bruit, no JVD, supple, symmetrical, trachea midline and thyroid not enlarged, symmetric, no tenderness/mass/nodules Lymph nodes: Cervical, supraclavicular, and axillary nodes normal. Heart:regular rate and rhythm, S1, S2 normal, no murmur, click, rub or gallop Lung:chest clear, no wheezing, rales, normal symmetric air entry, no tachypnea, retractions or cyanosis Abdomen: soft, non-tender, without masses or organomegaly EXT:no erythema, induration, or  nodules. Trace edema to the BLE. Neuro: no deficits.   Lab Results: Lab Results  Component Value Date   WBC 2.7* 12/26/2013   HGB 8.9* 12/26/2013   HCT 27.1* 12/26/2013   MCV 84.4 12/26/2013   PLT 66* 12/26/2013     Chemistry      Component Value Date/Time   NA 135* 12/26/2013 0847   NA 140 12/15/2012 1353   K 4.9 12/26/2013 0847   K 3.9 12/15/2012 1353   CL 105 05/08/2013  0856   CL 104 12/15/2012 1353   CO2 20* 12/26/2013 0847   CO2 26 12/15/2012 1353   BUN 38.7* 12/26/2013 0847   BUN 17 12/15/2012 1353   CREATININE 1.0 12/26/2013 0847   CREATININE 0.87 12/15/2012 1353      Component Value Date/Time   CALCIUM 9.6 12/26/2013 0847   CALCIUM 8.3* 12/15/2012 1353   ALKPHOS 175* 12/26/2013 0847   ALKPHOS 102 12/15/2012 1353   AST 33 12/26/2013 0847   AST 18 12/15/2012 1353   ALT 11 12/26/2013 0847   ALT 9 12/15/2012 1353   BILITOT 0.92 12/26/2013 0847   BILITOT 1.2 12/15/2012 1353          Impression and Plan: This is a 80 year old gentleman with the following issues:  1. Castration resistant prostate cancer. He is status post multiple treatments as mentioned above and recently progressed on Zytiga. He is on Xofigo and has been well tolerated. I fear that his condition continues to deteriorate and I am skeptical about his ability to finish the treatment course. I talked to him about hospice referral in the near future and continue with supportive care at this time. He would like to continue with the treatments for now unless his body cannot take anymore and I believe he is getting very close to it.  2. Protein calorie malnutrition. Remains on Megace. He has continued weight loss.n Encouraged him to drink 2-3 cans of Ensure daily.   4. Bony disease. He remains on Xgeva monthly at [facility].  5. Androgen deprivation. He remains on Lupron at [facility].  6. Anemia. Likely multifactorial at this time.Hgb is stable at this time with very little symptoms and we will hold off on transfusions.    7. LE edema. Improved at this time  8. Bone pain: Using Oxycodone as needed.   9. Follow-up. In in one week to recheck his blood and possible transfusion and a followup in 3 weeks for a clinical visit.     [facility] 1/16/20159:37 AM  Patient seen and examined today personally. He appeared more energetic and has less fatigue since the last time I saw him. His  examination today still revealed a pale gentleman but did not appear in any active distress. His vitals and laboratory data were reviewed today and seems to have improved with the blood transfusion.  I do not recommend any blood transfusion today and will continue supportive management as we have done in the past. I continue to discuss with him the overall picture of the treatment of his prostate cancer and the fact that if this current treatment doesn't work he might require hospice care.  [place] 12/26/2013

## 2013-12-26 NOTE — Telephone Encounter (Signed)
Per staff message and POF I have scheduled appts.  JMW  

## 2013-12-26 NOTE — Telephone Encounter (Signed)
appts made and printed...td 

## 2013-12-29 ENCOUNTER — Telehealth: Payer: Self-pay | Admitting: Radiation Oncology

## 2013-12-29 NOTE — Telephone Encounter (Signed)
Place copy of recent labs and weight on Dr. Johny Shears desk for review prior to Xfigo injection this week.

## 2013-12-31 ENCOUNTER — Telehealth: Payer: Self-pay | Admitting: *Deleted

## 2013-12-31 ENCOUNTER — Encounter (HOSPITAL_COMMUNITY): Payer: Self-pay

## 2013-12-31 NOTE — Telephone Encounter (Signed)
Called patient to remind of Xofigo injection on 01-01-14, spoke with patient's wife Barbaraann Share) and they are aware of this appt.

## 2013-12-31 NOTE — Progress Notes (Signed)
  Radiation Oncology         [phone] ________________________________  Name: Aaron Schaefer MRN: [id number]  Date: 01/01/2014  DOB: May 26, 1933  Radium-223 Infusion Note  Diagnosis:  Castration resistant prostate cancer with painful bone involvement  Current Infusion:    4  Planned Infusions:  6  Narrative: Mr. [name] presented to nuclear medicine for treatment. His most recent blood counts were reviewed.  He remains a good candidate to proceed with Ra-223.  The patient was situated in an infusion suite with a contact barrier placed under his arm. Intravenous access was established, using sterile technique, and a normal saline infusion from a syringe was started.  Micro-dosimetry:  The prescribed radiation activity was assayed and confirmed to be within specified tolerance.  Special Treatment Procedure - Infusion:  The nuclear medicine technologist and I personally verified the dose activity to be delivered as specified in the written directive, and verified the patient identification via 2 separate methods.  The syringe containing the dose was attached to a 3 way stopcock, and then the valve was opened to the patient, and the dose delivered over a minute. No complications were noted.  The total administered dose was 83.2 microcuries in a volume of 5 cc.  A saline flush of the line and the syringe that contained the isotope was then performed.  The entire IV tubing, venocatheter, stopcock and syringes was removed in total, placed in a disposal bag and sent for assay of the residual activity, which will be reported at a later time in our EMR by the physics staff.  Pressure was applied to the venipuncture site, and a compression bandage placed.  [facility]iation Safety personnel were present to perform the discharge survey, as detailed on their documentation.   After a short period of observation, the patient had his IV removed.  Impression:  The patient tolerated his infusion relatively  well.  Plan:  The patient will return in one month for ongoing care.    ________________________________  Sheral Apley. Tammi Klippel, M.D.  Addendum:  The residual radioactivity reported later by was Radiation Safety was 3.51 microcuries, so the actual infused isotope activity was 79.69 microcuries.

## 2014-01-01 ENCOUNTER — Other Ambulatory Visit: Payer: Self-pay | Admitting: Radiation Oncology

## 2014-01-01 ENCOUNTER — Encounter (HOSPITAL_COMMUNITY)
Admission: RE | Admit: 2014-01-01 | Discharge: 2014-01-01 | Disposition: A | Discharge status: Home / Self Care | Payer: Medicare Other | Source: Ambulatory Visit | Attending: Radiation Oncology | Admitting: Radiation Oncology

## 2014-01-01 DIAGNOSIS — C7951 Secondary malignant neoplasm of bone: Secondary | ICD-10-CM | POA: Diagnosis present

## 2014-01-01 DIAGNOSIS — C61 Malignant neoplasm of prostate: Secondary | ICD-10-CM | POA: Insufficient documentation

## 2014-01-01 DIAGNOSIS — C7952 Secondary malignant neoplasm of bone marrow: Secondary | ICD-10-CM

## 2014-01-02 ENCOUNTER — Other Ambulatory Visit (HOSPITAL_BASED_OUTPATIENT_CLINIC_OR_DEPARTMENT_OTHER): Payer: Medicare Other

## 2014-01-02 ENCOUNTER — Ambulatory Visit: Payer: Medicare Other

## 2014-01-02 DIAGNOSIS — D649 Anemia, unspecified: Secondary | ICD-10-CM

## 2014-01-02 DIAGNOSIS — C61 Malignant neoplasm of prostate: Secondary | ICD-10-CM

## 2014-01-02 LAB — CBC WITH DIFFERENTIAL/PLATELET
BASO%: 1.3 % (ref 0.0–2.0)
Basophils Absolute: 0.1 10*3/uL (ref 0.0–0.1)
EOS ABS: 0 10*3/uL (ref 0.0–0.5)
EOS%: 1 % (ref 0.0–7.0)
HCT: 24.4 % — ABNORMAL LOW (ref 38.4–49.9)
HGB: 8.3 g/dL — ABNORMAL LOW (ref 13.0–17.1)
LYMPH%: 11.7 % — ABNORMAL LOW (ref 14.0–49.0)
MCH: 28.9 pg (ref 27.2–33.4)
MCHC: 33.9 g/dL (ref 32.0–36.0)
MCV: 85.3 fL (ref 79.3–98.0)
MONO#: 0.5 10*3/uL (ref 0.1–0.9)
MONO%: 10.4 % (ref 0.0–14.0)
NEUT%: 75.6 % — ABNORMAL HIGH (ref 39.0–75.0)
NEUTROS ABS: 3.6 10*3/uL (ref 1.5–6.5)
PLATELETS: 93 10*3/uL — AB (ref 140–400)
RBC: 2.86 10*6/uL — ABNORMAL LOW (ref 4.20–5.82)
RDW: 17.4 % — AB (ref 11.0–14.6)
WBC: 4.7 10*3/uL (ref 4.0–10.3)
lymph#: 0.6 10*3/uL — ABNORMAL LOW (ref 0.9–3.3)

## 2014-01-02 LAB — HOLD TUBE, BLOOD BANK

## 2014-01-02 LAB — TECHNOLOGIST REVIEW

## 2014-01-02 NOTE — Progress Notes (Signed)
Patient's HGB today is 8.3. Patient denies SOB and CP. Per Erasmo Downer, NP, do not transfuse blood today. Patient and wife verbalized understanding. Patient has a scheduled appointment with Dr. Alen Blew.

## 2014-01-12 ENCOUNTER — Ambulatory Visit (HOSPITAL_COMMUNITY)
Admission: RE | Admit: 2014-01-12 | Discharge: 2014-01-12 | Disposition: A | Discharge status: Home / Self Care | Payer: Medicare Other | Source: Ambulatory Visit | Attending: Oncology | Admitting: Oncology

## 2014-01-12 DIAGNOSIS — D649 Anemia, unspecified: Secondary | ICD-10-CM | POA: Insufficient documentation

## 2014-01-12 DIAGNOSIS — C61 Malignant neoplasm of prostate: Secondary | ICD-10-CM

## 2014-01-13 ENCOUNTER — Telehealth: Payer: Self-pay | Admitting: *Deleted

## 2014-01-13 ENCOUNTER — Other Ambulatory Visit: Payer: Self-pay | Admitting: *Deleted

## 2014-01-13 DIAGNOSIS — C61 Malignant neoplasm of prostate: Secondary | ICD-10-CM

## 2014-01-13 NOTE — Progress Notes (Signed)
Wife louise calling to request home health care with [facility]. Per dr Alen Blew ok for referral. Done through epic.

## 2014-01-14 NOTE — Telephone Encounter (Signed)
Spoke with wife louise. [facility] to make a visit and a nurse will evaluate  Needs assessment for patient. Wife stated patient fell and hit his head on the wall. Instructed her to inform nurse of this.

## 2014-01-15 ENCOUNTER — Encounter: Payer: Self-pay | Admitting: *Deleted

## 2014-01-15 ENCOUNTER — Telehealth: Payer: Self-pay | Admitting: *Deleted

## 2014-01-15 NOTE — Progress Notes (Signed)
rec'd fax confirmation that [facility] will do a home health assessment of patient's needs /w in 24 hours. Wife notified.

## 2014-01-15 NOTE — Progress Notes (Signed)
Called patient, he states someone from [facility] just called him and a nurse would be there at 6:00 pm tonight.

## 2014-01-15 NOTE — Telephone Encounter (Signed)
Patient calling to say he has been dizzy and having nose bleeds. Asked if [facility] has made visit to the home? No, per patient and they have yet to receive a call to set up a visit. [facility] contacted, spoke with stephanie. she will check on this and call me back today. Encouraged patient and wife to keep regularly scheduled appt with dr Alen Blew tomorrow,  01/16/14. Wife verbalized understanding. Note to dr Hazeline Junker desk.

## 2014-01-16 ENCOUNTER — Telehealth: Payer: Self-pay | Admitting: Oncology

## 2014-01-16 ENCOUNTER — Telehealth: Payer: Self-pay | Admitting: *Deleted

## 2014-01-16 ENCOUNTER — Encounter: Payer: Self-pay | Admitting: Oncology

## 2014-01-16 ENCOUNTER — Other Ambulatory Visit (HOSPITAL_BASED_OUTPATIENT_CLINIC_OR_DEPARTMENT_OTHER): Payer: Medicare Other

## 2014-01-16 ENCOUNTER — Ambulatory Visit: Payer: Medicare Other | Admitting: Oncology

## 2014-01-16 ENCOUNTER — Ambulatory Visit (HOSPITAL_BASED_OUTPATIENT_CLINIC_OR_DEPARTMENT_OTHER): Payer: Medicare Other | Admitting: Oncology

## 2014-01-16 ENCOUNTER — Ambulatory Visit (HOSPITAL_BASED_OUTPATIENT_CLINIC_OR_DEPARTMENT_OTHER): Payer: Medicare Other

## 2014-01-16 VITALS — BP 103/48 | HR 109 | Resp 18 | Ht 66.0 in | Wt 129.9 lb

## 2014-01-16 VITALS — BP 91/56 | HR 97 | Temp 97.0°F | Resp 18

## 2014-01-16 DIAGNOSIS — M2550 Pain in unspecified joint: Secondary | ICD-10-CM

## 2014-01-16 DIAGNOSIS — R609 Edema, unspecified: Secondary | ICD-10-CM

## 2014-01-16 DIAGNOSIS — C61 Malignant neoplasm of prostate: Secondary | ICD-10-CM

## 2014-01-16 DIAGNOSIS — C7951 Secondary malignant neoplasm of bone: Secondary | ICD-10-CM

## 2014-01-16 DIAGNOSIS — R634 Abnormal weight loss: Secondary | ICD-10-CM

## 2014-01-16 DIAGNOSIS — C7952 Secondary malignant neoplasm of bone marrow: Secondary | ICD-10-CM

## 2014-01-16 DIAGNOSIS — D649 Anemia, unspecified: Secondary | ICD-10-CM

## 2014-01-16 DIAGNOSIS — M949 Disorder of cartilage, unspecified: Secondary | ICD-10-CM

## 2014-01-16 DIAGNOSIS — E46 Unspecified protein-calorie malnutrition: Secondary | ICD-10-CM

## 2014-01-16 DIAGNOSIS — IMO0001 Reserved for inherently not codable concepts without codable children: Secondary | ICD-10-CM

## 2014-01-16 DIAGNOSIS — M899 Disorder of bone, unspecified: Secondary | ICD-10-CM

## 2014-01-16 LAB — CBC WITH DIFFERENTIAL/PLATELET
BASO%: 0.9 % (ref 0.0–2.0)
BASOS ABS: 0 10*3/uL (ref 0.0–0.1)
EOS%: 3.2 % (ref 0.0–7.0)
Eosinophils Absolute: 0.1 10*3/uL (ref 0.0–0.5)
HCT: 17.5 % — ABNORMAL LOW (ref 38.4–49.9)
HEMOGLOBIN: 5.6 g/dL — AB (ref 13.0–17.1)
LYMPH#: 0.3 10*3/uL — AB (ref 0.9–3.3)
LYMPH%: 15.3 % (ref 14.0–49.0)
MCH: 28 pg (ref 27.2–33.4)
MCHC: 32 g/dL (ref 32.0–36.0)
MCV: 87.5 fL (ref 79.3–98.0)
MONO#: 0.2 10*3/uL (ref 0.1–0.9)
MONO%: 10.6 % (ref 0.0–14.0)
NEUT#: 1.5 10*3/uL (ref 1.5–6.5)
NEUT%: 70 % (ref 39.0–75.0)
Platelets: 51 10*3/uL — ABNORMAL LOW (ref 140–400)
RBC: 2 10*6/uL — ABNORMAL LOW (ref 4.20–5.82)
RDW: 19 % — ABNORMAL HIGH (ref 11.0–14.6)
WBC: 2.2 10*3/uL — ABNORMAL LOW (ref 4.0–10.3)
nRBC: 3 % — ABNORMAL HIGH (ref 0–0)

## 2014-01-16 LAB — HOLD TUBE, BLOOD BANK

## 2014-01-16 LAB — PREPARE RBC (CROSSMATCH)

## 2014-01-16 LAB — TECHNOLOGIST REVIEW

## 2014-01-16 MED ORDER — ACETAMINOPHEN 325 MG PO TABS
650.0000 mg | ORAL_TABLET | Freq: Once | ORAL | Status: AC
  Administered 2014-01-16: 650 mg via ORAL

## 2014-01-16 MED ORDER — DIPHENHYDRAMINE HCL 25 MG PO CAPS
ORAL_CAPSULE | ORAL | Status: AC
  Filled 2014-01-16: qty 1

## 2014-01-16 MED ORDER — DIPHENHYDRAMINE HCL 25 MG PO CAPS
25.0000 mg | ORAL_CAPSULE | Freq: Once | ORAL | Status: AC
  Administered 2014-01-16: 25 mg via ORAL

## 2014-01-16 MED ORDER — SODIUM CHLORIDE 0.9 % IV SOLN
250.0000 mL | Freq: Once | INTRAVENOUS | Status: AC
  Administered 2014-01-16: 250 mL via INTRAVENOUS

## 2014-01-16 MED ORDER — DEXAMETHASONE 4 MG PO TABS: 4.0000 mg | ORAL_TABLET | Freq: Two times a day (BID) | ORAL | Status: AC

## 2014-01-16 MED ORDER — ACETAMINOPHEN 325 MG PO TABS
ORAL_TABLET | ORAL | Status: AC
  Filled 2014-01-16: qty 2

## 2014-01-16 NOTE — Addendum Note (Signed)
Addended by: Wyatt Portela on: 01/16/2014 03:32 PM   Modules accepted: Orders

## 2014-01-16 NOTE — Telephone Encounter (Signed)
Per staff message and POF I have scheduled appts.  JMW  

## 2014-01-16 NOTE — Progress Notes (Signed)
Hematology and Oncology Follow Up Visit  Aaron Schaefer [id number] 07-04-1933 80 y.o. 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 Initial diagnosis was in 2001. Gleason score is 3+4 = 7. PSA was 4.96.  Prior Therapy:  1. S/P prostateectomy followed by radiation therapy at the time of diagnosis. 2. Started on degarelix on 11/21/10 due to a rising PSA. This was switched to Lupron in March 2012 due to pain at the injection site. 3. He developed bone mets at T3, T4, & T5 and was started on Casodex and Xgeva on 12/22/11. 4. Received Provenge 02/15/12 through 03/14/12. 5. He received Xtandi from June 2013 to September 2013. 6. S/P Systemic chemotherapy with Taxotere started on 09/16/12. S/P 5 cycles. Treatment stopped in 12/2012 due to poor tolerance.  7.  Zytiga 1000 mg daily on from 03/03/13 to 07/2013. This was stopped due to progression of disease.   Current therapy:  He remains on Lupron and Xgeva at [facility]. He is on Xofigo with four treatments given so far.    Interim History:  Aaron Schaefer is seen today for routine follow-up. Since the last visit, he continues to feel weak and lethargic. He is not reporting exertional dyspnea but still able to do very little out of the house and work. He still has very little lower extremity edema which is improving. He is reporting less diffuse bone pain in his shoulders and hips. He takes Oxycodone intermittently. States that he does not take it every day. Appetite is decreased and he has lost more weight. He does not have any localizing symptoms or any neurological deficits. He continued to decline rapidly and continue to lose weight. He refused hospice but was agreeable to proceed with home care agency. He reported also some occasional epistaxis at times. He does report diffuse arthralgias and myalgias and overall performance status continued to  decline.   Medications: I have reviewed the patient's current medications.   Current Outpatient Prescriptions  Medication Sig Dispense Refill  . calcium carbonate (OS-CAL) 600 MG TABS Take 600 mg by mouth 2 (two) times daily with a meal.      . dexamethasone (DECADRON) 4 MG tablet Take 1 tablet (4 mg total) by mouth 2 (two) times daily.  60 tablet  1  . gabapentin (NEURONTIN) 100 MG capsule Take 1 capsule (100 mg total) by mouth 3 (three) times daily.  90 capsule  1  . megestrol (MEGACE) 40 MG/ML suspension Take 5 mLs (200 mg total) by mouth 2 (two) times daily.  480 mL  1  . Naproxen Sodium (ALEVE PO) Take 220 mg by mouth every 12 (twelve) hours as needed.      Marland Kitchen oxyCODONE (ROXICODONE) 5 MG immediate release tablet Take 1 tablet (5 mg total) by mouth every 4 (four) hours as needed for severe pain.  30 tablet  0  . sennosides-docusate sodium (SENOKOT-S) 8.6-50 MG tablet Take 2 tablets by mouth daily.  60 tablet  3   No current facility-administered medications for this visit.    Allergies: No Known Allergies  Past Medical History, Surgical history, Social history, and Family History were reviewed and updated.  Review of Systems: Remaining ROS negative.  Physical Exam: Blood pressure 103/48, pulse 109, resp. rate 18, height 5\' 6"  (1.676 m), weight 129 lb 14.4 oz (58.922 kg), SpO2 99.00%. ECOG: 3 General appearance: alert, cooperative and no distress Head: Normocephalic, without obvious abnormality, atraumatic Neck:  no adenopathy, no carotid bruit, no JVD, supple, symmetrical, trachea midline and thyroid not enlarged, symmetric, no tenderness/mass/nodules Lymph nodes: Cervical, supraclavicular, and axillary nodes normal. Heart:regular rate and rhythm, S1, S2 normal, no murmur, click, rub or gallop Lung:chest clear, no wheezing, rales, normal symmetric air entry, no tachypnea, retractions or cyanosis Abdomen: soft, non-tender, without masses or organomegaly EXT:no erythema,  induration, or nodules. Trace edema to the BLE. Neuro: no deficits.   Lab Results: Lab Results  Component Value Date   WBC 2.2* 01/16/2014   HGB 5.6* 01/16/2014   HCT 17.5* 01/16/2014   MCV 87.5 01/16/2014   PLT 51* 01/16/2014     Chemistry      Component Value Date/Time   NA 135* 12/26/2013 0847   NA 140 12/15/2012 1353   K 4.9 12/26/2013 0847   K 3.9 12/15/2012 1353   CL 105 05/08/2013 0856   CL 104 12/15/2012 1353   CO2 20* 12/26/2013 0847   CO2 26 12/15/2012 1353   BUN 38.7* 12/26/2013 0847   BUN 17 12/15/2012 1353   CREATININE 1.0 12/26/2013 0847   CREATININE 0.87 12/15/2012 1353      Component Value Date/Time   CALCIUM 9.6 12/26/2013 0847   CALCIUM 8.3* 12/15/2012 1353   ALKPHOS 175* 12/26/2013 0847   ALKPHOS 102 12/15/2012 1353   AST 33 12/26/2013 0847   AST 18 12/15/2012 1353   ALT 11 12/26/2013 0847   ALT 9 12/15/2012 1353   BILITOT 0.92 12/26/2013 0847   BILITOT 1.2 12/15/2012 1353          Impression and Plan: This is a 80 year old gentleman with the following issues:  1. Castration resistant prostate cancer. He is status post multiple treatments as mentioned above and recently progressed on Zytiga. He is on Xofigo and has been well tolerated. I fear that his condition continues to deteriorate and I am skeptical about his ability to finish the treatment course. I talked to him about hospice referral in the near future and continue with supportive care at this time. He would like to continue with the treatments for now unless his body cannot take anymore and I believe he is getting very close to it.  2. Protein calorie malnutrition. I've given a prescription for dexamethasone 4 mg twice a day an attempt to improve his appetite, fatigue and joint pain.  4. Bony disease. He remains on Xgeva monthly at [facility].  5. Androgen deprivation. He remains on Lupron at [facility].  6. Anemia. Likely multifactorial at this time.Hgb is very low today and we will transfuse him  7. LE edema.  Improved at this time  8. Bone pain: Using Oxycodone as needed.   9. Follow-up. In 2 weeks to check his hemoglobin again to see if he needs another transfusion.  10. Prognosis: I have continued to address his prognosis with him and he understands he has a poor prognosis but we'll look to continue with the current management approach.

## 2014-01-16 NOTE — Patient Instructions (Addendum)
Sinusitis Sinusitis is redness, soreness, and puffiness (inflammation) of the air pockets in the bones of your face (sinuses). The redness, soreness, and puffiness can cause air and mucus to get trapped in your sinuses. This can allow germs to grow and cause an infection.  HOME CARE   Drink enough fluids to keep your pee (urine) clear or pale yellow.  Use a humidifier in your home.  Run a hot shower to create steam in the bathroom. Sit in the bathroom with the door closed. Breathe in the steam 3 4 times a day.  Put a warm, moist washcloth on your face 3 4 times a day, or as told by your doctor.  Use salt water sprays (saline sprays) to wet the thick fluid in your nose. This can help the sinuses drain.  Only take medicine as told by your doctor.   Blood Transfusion  A blood transfusion replaces your blood or some of its parts. Blood is replaced when you have lost blood because of surgery, an accident, or for severe blood conditions like anemia. You can donate blood to be used on yourself if you have a planned surgery. If you lose blood during that surgery, your own blood can be given back to you. Any blood given to you is checked to make sure it matches your blood type. Your temperature, blood pressure, and heart rate (vital signs) will be checked often.  GET HELP RIGHT AWAY IF:   You feel sick to your stomach (nauseous) or throw up (vomit).  You have watery poop (diarrhea).  You have shortness of breath or trouble breathing.  You have blood in your pee (urine) or have dark colored pee.  You have chest pain or tightness.  Your eyes or skin turn yellow (jaundice).  You have a temperature by mouth above 102 F (38.9 C), not controlled by medicine.  You start to shake and have chills.  You develop a a red rash (hives) or feel itchy.  You develop lightheadedness or feel confused.  You develop back, joint, or muscle pain.  You do not feel hungry (lost appetite).  You feel  tired, restless, or nervous.  You develop belly (abdominal) cramps. Document Released: 02/23/2009 Document Revised: 02/19/2012 Document Reviewed: 02/23/2009 [facility] Patient Information 2014 [place], [place].  Have a blessed weekend!!

## 2014-01-16 NOTE — Telephone Encounter (Signed)
Gave pt appt for lab and MD emailed Sharyn Lull regarding PRBC

## 2014-01-17 LAB — TYPE AND SCREEN
ABO/RH(D): O NEG
ANTIBODY SCREEN: NEGATIVE
UNIT DIVISION: 0
Unit division: 0

## 2014-01-19 ENCOUNTER — Telehealth: Payer: Self-pay | Admitting: Oncology

## 2014-01-19 NOTE — Telephone Encounter (Signed)
Called pt and left messsage regarding PRBC and md visit for February and March 2015

## 2014-01-20 ENCOUNTER — Encounter: Payer: Self-pay | Admitting: *Deleted

## 2014-01-20 NOTE — Progress Notes (Signed)
Received  call from tiffany in central scheduling. Patient refusing to have port-a-cath placed.  Note to dr Hazeline Junker desk.

## 2014-01-26 ENCOUNTER — Telehealth: Payer: Self-pay | Admitting: Medical Oncology

## 2014-01-26 MED ORDER — METOCLOPRAMIDE HCL 10 MG PO TABS: 10.0000 mg | ORAL_TABLET | Freq: Four times a day (QID) | ORAL | Status: AC | PRN

## 2014-01-26 NOTE — Telephone Encounter (Signed)
Patient's wife called reporting that patient has had hiccups x 1 week, reports hiccups are "on and off, seems more at night." States patient denies no new pain, or heartburn, and reports the only new medication he has started has been decadron. Reviewed symptoms with MD, prescription for Reglan 10mg  q6hr prn #30.  Informed spouse prescription sent to patient's listed pharmacy. Wife expressed thanks, denies further questions at this time.

## 2014-01-27 ENCOUNTER — Telehealth: Payer: Self-pay | Admitting: Medical Oncology

## 2014-01-27 NOTE — Telephone Encounter (Signed)
Margaretha Sheffield with [facility] requesting orders for patient to have OT & PT, needs assistance with ADL's and strengthening exercises. Per MD, ok. V.O. Given to [place].

## 2014-01-29 ENCOUNTER — Telehealth: Payer: Self-pay | Admitting: *Deleted

## 2014-01-29 NOTE — Telephone Encounter (Signed)
Called patient to inform of Xofigo Inj. On 02-05-14 @ 12 pm @ [facility], spoke with patient's wife and she is aware of this appt.

## 2014-01-30 ENCOUNTER — Other Ambulatory Visit: Payer: Self-pay | Admitting: Oncology

## 2014-01-30 ENCOUNTER — Other Ambulatory Visit (HOSPITAL_BASED_OUTPATIENT_CLINIC_OR_DEPARTMENT_OTHER): Payer: Medicare Other

## 2014-01-30 ENCOUNTER — Encounter: Payer: Self-pay | Admitting: Radiation Oncology

## 2014-01-30 ENCOUNTER — Ambulatory Visit: Payer: Medicare Other

## 2014-01-30 DIAGNOSIS — D649 Anemia, unspecified: Secondary | ICD-10-CM

## 2014-01-30 DIAGNOSIS — C61 Malignant neoplasm of prostate: Secondary | ICD-10-CM

## 2014-01-30 LAB — CBC WITH DIFFERENTIAL/PLATELET
BASO%: 0.9 % (ref 0.0–2.0)
Basophils Absolute: 0.1 10*3/uL (ref 0.0–0.1)
EOS ABS: 0 10*3/uL (ref 0.0–0.5)
EOS%: 0 % (ref 0.0–7.0)
HCT: 26.1 % — ABNORMAL LOW (ref 38.4–49.9)
HGB: 8.5 g/dL — ABNORMAL LOW (ref 13.0–17.1)
LYMPH%: 4.6 % — AB (ref 14.0–49.0)
MCH: 29 pg (ref 27.2–33.4)
MCHC: 32.6 g/dL (ref 32.0–36.0)
MCV: 89.1 fL (ref 79.3–98.0)
MONO#: 0.9 10*3/uL (ref 0.1–0.9)
MONO%: 15.5 % — ABNORMAL HIGH (ref 0.0–14.0)
NEUT%: 79 % — ABNORMAL HIGH (ref 39.0–75.0)
NEUTROS ABS: 4.5 10*3/uL (ref 1.5–6.5)
NRBC: 5 % — AB (ref 0–0)
PLATELETS: 42 10*3/uL — AB (ref 140–400)
RBC: 2.93 10*6/uL — AB (ref 4.20–5.82)
RDW: 18.8 % — AB (ref 11.0–14.6)
WBC: 5.7 10*3/uL (ref 4.0–10.3)
lymph#: 0.3 10*3/uL — ABNORMAL LOW (ref 0.9–3.3)

## 2014-01-30 LAB — HOLD TUBE, BLOOD BANK

## 2014-01-30 LAB — TECHNOLOGIST REVIEW

## 2014-01-30 NOTE — Progress Notes (Signed)
Obtained weight in preparation for Xyfigo injection.

## 2014-01-30 NOTE — Progress Notes (Signed)
Patient denies SOB of increased fatigue, patient declines visual bleeds. No transfusion needed per Dr. Alen Blew.

## 2014-02-02 ENCOUNTER — Telehealth: Payer: Self-pay | Admitting: *Deleted

## 2014-02-02 ENCOUNTER — Other Ambulatory Visit: Payer: Self-pay | Admitting: *Deleted

## 2014-02-02 ENCOUNTER — Ambulatory Visit (HOSPITAL_COMMUNITY): Payer: Medicare Other

## 2014-02-02 DIAGNOSIS — C61 Malignant neoplasm of prostate: Secondary | ICD-10-CM

## 2014-02-02 MED ORDER — OXYCODONE HCL 5 MG PO TABS: 5.0000 mg | ORAL_TABLET | ORAL | Status: AC | PRN

## 2014-02-02 MED ORDER — POLYETHYLENE GLYCOL 3350 17 G PO PACK: 17.0000 g | PACK | Freq: Two times a day (BID) | ORAL | Status: AC

## 2014-02-02 NOTE — Telephone Encounter (Signed)
Wife called for refill on oxycodone  5 mg tablets, signed script left at front for p/u. Wife notified.

## 2014-02-02 NOTE — Telephone Encounter (Signed)
Wife calling to say patient has had to be dis-impacted manually  Twice now. He is on calcium bid and only has sennokot-s bid. Per Burnetta Sabin, okay to stop calcium for now. Start miralax 17 gm bid, along with the sennokot-s  until  Relief and then take miralax daily. Wife verbalizes understanding.

## 2014-02-02 NOTE — Telephone Encounter (Signed)
Nurse elaine with [facility] calling to say patient was dis-empacted today. Had 3 fleets enemas and mag citrate over the week-end. Decreased intake. Only 8 oz of ensure and 8 oz of water today. Having trouble focusing. Asked his wife today" how old am i?" memory loss noted. Wife ready for hospice, patient still unsure. Note to dr Hazeline Junker desk.

## 2014-02-05 ENCOUNTER — Encounter (HOSPITAL_COMMUNITY): Admission: RE | Admit: 2014-02-05 | Payer: Medicare Other | Source: Ambulatory Visit

## 2014-02-05 ENCOUNTER — Telehealth: Payer: Self-pay | Admitting: *Deleted

## 2014-02-05 NOTE — Telephone Encounter (Signed)
XXXX 

## 2014-02-05 NOTE — Telephone Encounter (Signed)
Called patient to inform that injection will happen tomorrow @ 12:00 pm @ [facility]., spoke with patient's wife and she is aware of the appt. Change.

## 2014-02-06 ENCOUNTER — Telehealth: Payer: Self-pay | Admitting: *Deleted

## 2014-02-06 ENCOUNTER — Encounter: Payer: Self-pay | Admitting: Radiation Oncology

## 2014-02-06 ENCOUNTER — Ambulatory Visit (HOSPITAL_BASED_OUTPATIENT_CLINIC_OR_DEPARTMENT_OTHER): Payer: Medicare Other

## 2014-02-06 ENCOUNTER — Encounter (HOSPITAL_COMMUNITY): Payer: Self-pay

## 2014-02-06 ENCOUNTER — Ambulatory Visit
Admission: RE | Admit: 2014-02-06 | Discharge: 2014-02-06 | Disposition: A | Discharge status: Home / Self Care | Payer: Medicare Other | Source: Ambulatory Visit | Attending: Radiation Oncology | Admitting: Radiation Oncology

## 2014-02-06 ENCOUNTER — Ambulatory Visit (HOSPITAL_COMMUNITY)
Admission: RE | Admit: 2014-02-06 | Discharge: 2014-02-06 | Disposition: A | Discharge status: Home / Self Care | Payer: Medicare Other | Source: Ambulatory Visit | Attending: Radiation Oncology | Admitting: Radiation Oncology

## 2014-02-06 VITALS — BP 88/60 | HR 101

## 2014-02-06 VITALS — BP 86/48 | HR 57 | Temp 96.7°F

## 2014-02-06 DIAGNOSIS — C7952 Secondary malignant neoplasm of bone marrow: Secondary | ICD-10-CM

## 2014-02-06 DIAGNOSIS — R5381 Other malaise: Secondary | ICD-10-CM

## 2014-02-06 DIAGNOSIS — C7951 Secondary malignant neoplasm of bone: Secondary | ICD-10-CM

## 2014-02-06 DIAGNOSIS — R5383 Other fatigue: Secondary | ICD-10-CM

## 2014-02-06 DIAGNOSIS — C61 Malignant neoplasm of prostate: Secondary | ICD-10-CM | POA: Insufficient documentation

## 2014-02-06 MED ORDER — SODIUM CHLORIDE 0.9 % IV SOLN
1000.0000 mL | Freq: Once | INTRAVENOUS | Status: AC
  Administered 2014-02-06: 1000 mL via INTRAVENOUS

## 2014-02-06 NOTE — Patient Instructions (Signed)

## 2014-02-06 NOTE — Progress Notes (Signed)
  Radiation Oncology         [phone]) [phone] ________________________________  Name: Aaron Schaefer MRN: [id number]  Date: 02/06/2014  DOB: Dec 20, 1932  Radium-223 Infusion Note  Diagnosis:  Castration resistant prostate cancer with painful bone involvement  Current Infusion:    5  Planned Infusions:  6  Narrative: [name] presented to nuclear medicine for treatment. His most recent blood counts were reviewed.  He remains a good candidate to proceed with Ra-223.  The patient was situated in an infusion suite with a contact barrier placed under his arm. Intravenous access was established, using sterile technique, and a normal saline infusion from a syringe was started.  Micro-dosimetry:  The prescribed radiation activity was assayed and confirmed to be within specified tolerance.  Special Treatment Procedure - Infusion:  The nuclear medicine technologist and I personally verified the dose activity to be delivered as specified in the written directive, and verified the patient identification via 2 separate methods.  The syringe containing the dose was attached to a 3 way stopcock, and then the valve was opened to the patient, and the dose delivered over a minute. No complications were noted.  The total administered dose was 76.6 microcuries in a volume of 5 cc.   A saline flush of the line and the syringe that contained the isotope was then performed.  The residual radioactivity in the syringe was 2.83 microcuries, so the actual infused isotope activity was 73.77 microcuries.   Pressure was applied to the venipuncture site, and a compression bandage placed.   Radiation Safety personnel were present to perform the discharge survey, as detailed on their documentation.   After a short period of observation, the patient had his IV removed.  Impression:  The patient tolerated his infusion relatively well.  Plan:  The patient will return in one month for ongoing care.      ________________________________  Sheral Apley. Tammi Klippel, M.D.

## 2014-02-06 NOTE — Progress Notes (Signed)
Reverend [employer] received [place] injection today.  He is traveling by wheelchair and appears very fatigued.  Ashen skin tone noted.  Orthostasis present as evidenced by sitting and standing vitals.  It is reported that he is not drinking fluids and is eating poorly.  He states that after eating a few bites of food, he does not want anymore.  He is drinking 3 ensure plus daily which is basically his sole intake.

## 2014-02-06 NOTE — Telephone Encounter (Signed)
Called patient to inform that injection has been moved up to 11:30 am today, spoke with his wife Barbaraann Share and she is aware of this appt. Being moved up.

## 2014-02-06 NOTE — Telephone Encounter (Signed)
Butch Penny, P.T. With advanced calling to say patient missed his  appt this am d/t making up another dr o.v., that was missed d/t weather. Note to dr Hazeline Junker office

## 2014-02-07 ENCOUNTER — Encounter: Payer: Self-pay | Admitting: Radiation Oncology

## 2014-02-07 NOTE — Progress Notes (Signed)
  Radiation Oncology         [phone] ________________________________  Name: Aaron Schaefer MRN: [id number]  Date: 02/06/2014  DOB: 1933-03-06  Follow-Up Visit Note  CC: Horton Finer, MD  Wyatt Portela, MD  Diagnosis:   Prostate Cancer  Narrative:  The patient returns to the clinic today after Xofigo infusion for likely hypovolemic orthostatic hypotension following diarrahea                              ALLERGIES:  has No Known Allergies.  Meds: Current Outpatient Prescriptions  Medication Sig Dispense Refill  . dexamethasone (DECADRON) 4 MG tablet Take 1 tablet (4 mg total) by mouth 2 (two) times daily.  60 tablet  1  . megestrol (MEGACE) 40 MG/ML suspension Take 5 mLs (200 mg total) by mouth 2 (two) times daily.  480 mL  1  . metoCLOPramide (REGLAN) 10 MG tablet Take 1 tablet (10 mg total) by mouth every 6 (six) hours as needed (for hiccups).  30 tablet  0  . Naproxen Sodium (ALEVE PO) Take 220 mg by mouth every 12 (twelve) hours as needed.      Marland Kitchen oxyCODONE (ROXICODONE) 5 MG immediate release tablet Take 1 tablet (5 mg total) by mouth every 4 (four) hours as needed for severe pain.  30 tablet  0  . polyethylene glycol (MIRALAX) packet Take 17 g by mouth 2 (two) times daily.  28 each  0  . sennosides-docusate sodium (SENOKOT-S) 8.6-50 MG tablet Take 2 tablets by mouth daily.  60 tablet  3  . gabapentin (NEURONTIN) 100 MG capsule Take 1 capsule (100 mg total) by mouth 3 (three) times daily.  90 capsule  1   No current facility-administered medications for this encounter.    Physical Findings: The patient is in no acute distress. Patient is alert and oriented.  blood pressure is 88/60 and his pulse is 101. .  No significant changes.  Lab Findings: Lab Results  Component Value Date   WBC 5.7 01/30/2014   HGB 8.5* 01/30/2014   HCT 26.1* 01/30/2014   MCV 89.1 01/30/2014   PLT 42* 01/30/2014    Radiographic Findings: Nm Xofigo Injection  02/06/2014   CLINICAL  DATA: prostate ca    Trudi Ida was injected intravenously in Nuclear Medicine under the  supervision of the attending radiologist     Impression:  The patient has a low systolic pressure and borderline Hgb.  It is presumed that with hydration, his Hgb will be lower.  Plan:  Discussed hydration vs. Transfusion with Dr. Hazeline Junker team.  Wilhelmina Mcardle to give 1 liter of NS and re-check Hgb, with discharge to home if Hgb greater than 8.0.  _____________________________________  Sheral Apley. Tammi Klippel, M.D.

## 2014-02-09 ENCOUNTER — Telehealth: Payer: Self-pay | Admitting: *Deleted

## 2014-02-09 ENCOUNTER — Ambulatory Visit (HOSPITAL_COMMUNITY)
Admission: RE | Admit: 2014-02-09 | Discharge: 2014-02-09 | Disposition: A | Discharge status: Home / Self Care | Payer: Medicare Other | Source: Ambulatory Visit | Attending: Oncology | Admitting: Oncology

## 2014-02-09 DIAGNOSIS — D649 Anemia, unspecified: Secondary | ICD-10-CM | POA: Insufficient documentation

## 2014-02-09 NOTE — Telephone Encounter (Signed)
Hospice referral called into [facility] per Dr Alen Blew order. Wife notified

## 2014-02-09 NOTE — Telephone Encounter (Signed)
Pt's wife called to say pt is very weak and she is requesting a Hospice referral.

## 2014-02-10 ENCOUNTER — Telehealth: Payer: Self-pay | Admitting: *Deleted

## 2014-02-10 ENCOUNTER — Telehealth: Payer: Self-pay | Admitting: Radiation Oncology

## 2014-02-10 NOTE — Telephone Encounter (Signed)
Faxed 02/06/2014 Radium 223 Infusion Note to Mercie Eon, RN for Hospice. Confirmation fax of delivery obtained.

## 2014-02-10 NOTE — Telephone Encounter (Signed)
Received call from Mercie Eon, RN, Hospice Admissions. She states Dr Alen Blew referred pt to Hospice, and she is requesting information on pt's current treatments. She specifically requests Dr Johny Shears 02/06/14 note on Xofigo injection.  Fax [phone], Ms Gerilyn Nestle cell # [phone]. Information relayed to Sam, RN for Dr Tammi Klippel.

## 2014-02-11 ENCOUNTER — Other Ambulatory Visit: Payer: Self-pay | Admitting: Medical Oncology

## 2014-02-11 DIAGNOSIS — C7951 Secondary malignant neoplasm of bone: Secondary | ICD-10-CM

## 2014-02-11 NOTE — Progress Notes (Signed)
Informed Lurlean Leyden, home care coordinator, of new orders for patient to have continuation of home care.  Late entry: Spoke with wife yesterday (03/03) regarding hospice visit and per wife, states hospice not able to proceed till after patient has had sixth radiation treatment.

## 2014-02-12 ENCOUNTER — Telehealth: Payer: Self-pay | Admitting: Medical Oncology

## 2014-02-12 NOTE — Telephone Encounter (Signed)
Butch Penny, physical therapist, with [facility], called to inform office patient with elevated heart rate "high 90's", light headed and "declining," B/P 106/60, but does not feel he needs to go to ED. Also reports, per wife, patient's intake decreasing, sleeps most of day and low energy. Patient has appt tomorrow with NP as well as labs and possible blood transfusion, if needed.  Mssg placed in MD inbox for review.

## 2014-02-13 ENCOUNTER — Other Ambulatory Visit: Payer: Self-pay | Admitting: Radiation Oncology

## 2014-02-13 ENCOUNTER — Telehealth: Payer: Self-pay | Admitting: *Deleted

## 2014-02-13 ENCOUNTER — Ambulatory Visit (HOSPITAL_BASED_OUTPATIENT_CLINIC_OR_DEPARTMENT_OTHER): Payer: Medicare Other | Admitting: Oncology

## 2014-02-13 ENCOUNTER — Encounter: Payer: Self-pay | Admitting: Oncology

## 2014-02-13 ENCOUNTER — Ambulatory Visit (HOSPITAL_BASED_OUTPATIENT_CLINIC_OR_DEPARTMENT_OTHER): Payer: Medicare Other

## 2014-02-13 ENCOUNTER — Other Ambulatory Visit (HOSPITAL_BASED_OUTPATIENT_CLINIC_OR_DEPARTMENT_OTHER): Payer: Medicare Other

## 2014-02-13 ENCOUNTER — Telehealth: Payer: Self-pay | Admitting: Oncology

## 2014-02-13 VITALS — BP 98/55 | HR 84 | Temp 98.2°F | Resp 18

## 2014-02-13 VITALS — BP 92/57 | HR 104 | Temp 97.9°F | Resp 17 | Ht 66.0 in | Wt 129.4 lb

## 2014-02-13 DIAGNOSIS — C7952 Secondary malignant neoplasm of bone marrow: Secondary | ICD-10-CM

## 2014-02-13 DIAGNOSIS — C61 Malignant neoplasm of prostate: Secondary | ICD-10-CM

## 2014-02-13 DIAGNOSIS — E291 Testicular hypofunction: Secondary | ICD-10-CM

## 2014-02-13 DIAGNOSIS — D649 Anemia, unspecified: Secondary | ICD-10-CM

## 2014-02-13 DIAGNOSIS — C7951 Secondary malignant neoplasm of bone: Secondary | ICD-10-CM

## 2014-02-13 DIAGNOSIS — E46 Unspecified protein-calorie malnutrition: Secondary | ICD-10-CM

## 2014-02-13 DIAGNOSIS — D696 Thrombocytopenia, unspecified: Secondary | ICD-10-CM

## 2014-02-13 LAB — CBC WITH DIFFERENTIAL/PLATELET
BASO%: 1.1 % (ref 0.0–2.0)
Basophils Absolute: 0 10*3/uL (ref 0.0–0.1)
EOS ABS: 0 10*3/uL (ref 0.0–0.5)
EOS%: 0 % (ref 0.0–7.0)
HCT: 17.7 % — ABNORMAL LOW (ref 38.4–49.9)
HGB: 5.7 g/dL — CL (ref 13.0–17.1)
LYMPH%: 8.9 % — ABNORMAL LOW (ref 14.0–49.0)
MCH: 29.5 pg (ref 27.2–33.4)
MCHC: 32.2 g/dL (ref 32.0–36.0)
MCV: 91.7 fL (ref 79.3–98.0)
MONO#: 0.2 10*3/uL (ref 0.1–0.9)
MONO%: 8.3 % (ref 0.0–14.0)
NEUT#: 1.5 10*3/uL (ref 1.5–6.5)
NEUT%: 81.7 % — ABNORMAL HIGH (ref 39.0–75.0)
NRBC: 8 % — AB (ref 0–0)
Platelets: 19 10*3/uL — ABNORMAL LOW (ref 140–400)
RBC: 1.93 10*6/uL — AB (ref 4.20–5.82)
RDW: 20.4 % — AB (ref 11.0–14.6)
WBC: 1.8 10*3/uL — AB (ref 4.0–10.3)
lymph#: 0.2 10*3/uL — ABNORMAL LOW (ref 0.9–3.3)

## 2014-02-13 LAB — TECHNOLOGIST REVIEW

## 2014-02-13 LAB — HOLD TUBE, BLOOD BANK

## 2014-02-13 LAB — PREPARE RBC (CROSSMATCH)

## 2014-02-13 MED ORDER — ACETAMINOPHEN 325 MG PO TABS
650.0000 mg | ORAL_TABLET | Freq: Once | ORAL | Status: AC
  Administered 2014-02-13: 650 mg via ORAL

## 2014-02-13 MED ORDER — ACETAMINOPHEN 325 MG PO TABS
ORAL_TABLET | ORAL | Status: AC
  Filled 2014-02-13: qty 2

## 2014-02-13 MED ORDER — DIPHENHYDRAMINE HCL 25 MG PO CAPS
ORAL_CAPSULE | ORAL | Status: AC
  Filled 2014-02-13: qty 1

## 2014-02-13 MED ORDER — SODIUM CHLORIDE 0.9 % IV SOLN
250.0000 mL | Freq: Once | INTRAVENOUS | Status: AC
  Administered 2014-02-13: 250 mL via INTRAVENOUS

## 2014-02-13 MED ORDER — DIPHENHYDRAMINE HCL 25 MG PO CAPS
25.0000 mg | ORAL_CAPSULE | Freq: Once | ORAL | Status: AC
  Administered 2014-02-13: 25 mg via ORAL

## 2014-02-13 NOTE — Telephone Encounter (Signed)
Amy RN , will get appt calendar for pt todat for lab and MD and [facility] every 2 weeks

## 2014-02-13 NOTE — Patient Instructions (Signed)
Blood Transfusion Information WHAT IS A BLOOD TRANSFUSION? A transfusion is the replacement of blood or some of its parts. Blood is made up of multiple cells which provide different functions.  Red blood cells carry oxygen and are used for blood loss replacement.  White blood cells fight against infection.  Platelets control bleeding.  Plasma helps clot blood.  Other blood products are available for specialized needs, such as hemophilia or other clotting disorders. BEFORE THE TRANSFUSION  Who gives blood for transfusions?   You may be able to donate blood to be used at a later date on yourself (autologous donation).  Relatives can be asked to donate blood. This is generally not any safer than if you have received blood from a stranger. The same precautions are taken to ensure safety when a relative's blood is donated.  Healthy volunteers who are fully evaluated to make sure their blood is safe. This is blood bank blood. Transfusion therapy is the safest it has ever been in the practice of medicine. Before blood is taken from a donor, a complete history is taken to make sure that person has no history of diseases nor engages in risky social behavior (examples are intravenous drug use or sexual activity with multiple partners). The donor's travel history is screened to minimize risk of transmitting infections, such as malaria. The donated blood is tested for signs of infectious diseases, such as HIV and hepatitis. The blood is then tested to be sure it is compatible with you in order to minimize the chance of a transfusion reaction. If you or a relative donates blood, this is often done in anticipation of surgery and is not appropriate for emergency situations. It takes many days to process the donated blood. RISKS AND COMPLICATIONS Although transfusion therapy is very safe and saves many lives, the main dangers of transfusion include:   Getting an infectious disease.  Developing a  transfusion reaction. This is an allergic reaction to something in the blood you were given. Every precaution is taken to prevent this. The decision to have a blood transfusion has been considered carefully by your caregiver before blood is given. Blood is not given unless the benefits outweigh the risks. AFTER THE TRANSFUSION  Right after receiving a blood transfusion, you will usually feel much better and more energetic. This is especially true if your red blood cells have gotten low (anemic). The transfusion raises the level of the red blood cells which carry oxygen, and this usually causes an energy increase.  The nurse administering the transfusion will monitor you carefully for complications. HOME CARE INSTRUCTIONS  No special instructions are needed after a transfusion. You may find your energy is better. Speak with your caregiver about any limitations on activity for underlying diseases you may have. SEEK MEDICAL CARE IF:   Your condition is not improving after your transfusion.  You develop redness or irritation at the intravenous (IV) site. SEEK IMMEDIATE MEDICAL CARE IF:  Any of the following symptoms occur over the next 12 hours:  Shaking chills.  You have a temperature by mouth above 102 F (38.9 C), not controlled by medicine.  Chest, back, or muscle pain.  People around you feel you are not acting correctly or are confused.  Shortness of breath or difficulty breathing.  Dizziness and fainting.  You get a rash or develop hives.  You have a decrease in urine output.  Your urine turns a dark color or changes to pink, red, or brown. Any of the following   symptoms occur over the next 10 days:  You have a temperature by mouth above 102 F (38.9 C), not controlled by medicine.  Shortness of breath.  Weakness after normal activity.  The white part of the eye turns yellow (jaundice).  You have a decrease in the amount of urine or are urinating less often.  Your  urine turns a dark color or changes to pink, red, or brown. Document Released: 11/24/2000 Document Revised: 02/19/2012 Document Reviewed: 07/13/2008 Exit[employer] Patient Information 2014 [employer].  

## 2014-02-13 NOTE — Telephone Encounter (Signed)
Per staff message and POF I have scheduled appts.  JMW  

## 2014-02-13 NOTE — Progress Notes (Signed)
Hematology and Oncology Follow Up Visit  Aaron Schaefer [id number] 1933/08/28 80 y.o. 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 Initial diagnosis was in 2001. Gleason score is 3+4 = 7. PSA was 4.96.  Prior Therapy:  1. S/P prostateectomy followed by radiation therapy at the time of diagnosis. 2. Started on degarelix on 11/21/10 due to a rising PSA. This was switched to Lupron in March 2012 due to pain at the injection site. 3. He developed bone mets at T3, T4, & T5 and was started on Casodex and Xgeva on 12/22/11. 4. Received Provenge 02/15/12 through 03/14/12. 5. He received Xtandi from June 2013 to September 2013. 6. S/P Systemic chemotherapy with Taxotere started on 09/16/12. S/P 5 cycles. Treatment stopped in 12/2012 due to poor tolerance.  7.  Zytiga 1000 mg daily on from 03/03/13 to 07/2013. This was stopped due to progression of disease.   Current therapy:  He remains on Lupron and Xgeva at [facility]. He is on Xofigo with five treatments given so far.    Interim History:  Aaron Schaefer is seen today for routine follow-up. Since the last visit, he continues to feel weak and lethargic. Reports exertional dyspnea. He still has very little lower extremity edema which is improving. He is reporting less diffuse bone pain in his shoulders and hips. He takes Oxycodone intermittently. States that he takes it at least once a day.  Appetite is decreased, but weight is stable. He does not have any localizing symptoms or any neurological deficits. He continues to decline. He was referred to Hospice per his request, but could not be admitted since he remains on Xofigo with one additional treatment planned. He reported also some occasional epistaxis at times. Reports constipation. He is currently taking Miralax daily and 2 stool softeners daily.    Medications: I have reviewed the patient's current  medications.   Current Outpatient Prescriptions  Medication Sig Dispense Refill  . dexamethasone (DECADRON) 4 MG tablet Take 1 tablet (4 mg total) by mouth 2 (two) times daily.  60 tablet  1  . gabapentin (NEURONTIN) 100 MG capsule Take 1 capsule (100 mg total) by mouth 3 (three) times daily.  90 capsule  1  . megestrol (MEGACE) 40 MG/ML suspension Take 5 mLs (200 mg total) by mouth 2 (two) times daily.  480 mL  1  . metoCLOPramide (REGLAN) 10 MG tablet Take 1 tablet (10 mg total) by mouth every 6 (six) hours as needed (for hiccups).  30 tablet  0  . Naproxen Sodium (ALEVE PO) Take 220 mg by mouth every 12 (twelve) hours as needed.      Marland Kitchen oxyCODONE (ROXICODONE) 5 MG immediate release tablet Take 1 tablet (5 mg total) by mouth every 4 (four) hours as needed for severe pain.  30 tablet  0  . polyethylene glycol (MIRALAX) packet Take 17 g by mouth 2 (two) times daily.  28 each  0  . sennosides-docusate sodium (SENOKOT-S) 8.6-50 MG tablet Take 2 tablets by mouth daily.  60 tablet  3   No current facility-administered medications for this visit.   Facility-Administered Medications Ordered in Other Visits  Medication Dose Route Frequency Provider Last Rate Last Dose  . acetaminophen (TYLENOL) tablet 650 mg  650 mg Oral Once Aaron Shape, NP      . diphenhydrAMINE (BENADRYL) capsule 25 mg  25 mg Oral Once Aaron Shape, NP  Allergies: No Known Allergies  Past Medical History, Surgical history, Social history, and Family History were reviewed and updated.  Review of Systems: Remaining ROS negative.  Physical Exam: Blood pressure 92/57, pulse 104, temperature 97.9 F (36.6 C), resp. rate 17, height 5\' 6"  (1.676 m), weight 129 lb 6.4 oz (58.695 kg), SpO2 100.00%. ECOG: 3 General appearance: alert, cooperative and no distress Head: Normocephalic, without obvious abnormality, atraumatic Neck: no adenopathy, no carotid bruit, no JVD, supple, symmetrical, trachea midline and thyroid  not enlarged, symmetric, no tenderness/mass/nodules Lymph nodes: Cervical, supraclavicular, and axillary nodes normal. Heart:regular rate and rhythm, S1, S2 normal, no murmur, click, rub or gallop Lung:chest clear, no wheezing, rales, normal symmetric air entry, no tachypnea, retractions or cyanosis Abdomen: soft, non-tender, without masses or organomegaly EXT:no erythema, induration, or nodules. Trace edema to the BLE. Neuro: no deficits.   Lab Results: Lab Results  Component Value Date   WBC 1.8* 02/13/2014   HGB 5.7* 02/13/2014   HCT 17.7* 02/13/2014   MCV 91.7 02/13/2014   PLT 19* 02/13/2014     Chemistry      Component Value Date/Time   NA 135* 12/26/2013 0847   NA 140 12/15/2012 1353   K 4.9 12/26/2013 0847   K 3.9 12/15/2012 1353   CL 105 05/08/2013 0856   CL 104 12/15/2012 1353   CO2 20* 12/26/2013 0847   CO2 26 12/15/2012 1353   BUN 38.7* 12/26/2013 0847   BUN 17 12/15/2012 1353   CREATININE 1.0 12/26/2013 0847   CREATININE 0.87 12/15/2012 1353      Component Value Date/Time   CALCIUM 9.6 12/26/2013 0847   CALCIUM 8.3* 12/15/2012 1353   ALKPHOS 175* 12/26/2013 0847   ALKPHOS 102 12/15/2012 1353   AST 33 12/26/2013 0847   AST 18 12/15/2012 1353   ALT 11 12/26/2013 0847   ALT 9 12/15/2012 1353   BILITOT 0.92 12/26/2013 0847   BILITOT 1.2 12/15/2012 1353      Impression and Plan: This is a 80 year old gentleman with the following issues:  1. Castration resistant prostate cancer. He is status post multiple treatments as mentioned above and recently progressed on Zytiga. He is on Xofigo and has been well tolerated. I fear that his condition continues to deteriorate and I am skeptical about his ability to finish the treatment course. He is agreeable to Hospice and plans to enroll once his Xofigo injections are complete; likely later this month. Per patient's request, out of facility DNR has been signed. He confirmed that he does not desire CPR or mechanical ventilation.   2. Protein calorie malnutrition.  Continue dexamethasone 4 mg twice a day an attempt to improve his appetite, fatigue and joint pain.  4. Bony disease. He remains on Xgeva monthly at [facility].  5. Androgen deprivation. He remains on Lupron at [facility].  6. Anemia. Likely multifactorial at this time.Hgb is very low today and we will transfuse him  7. LE edema. Improved at this time  8. Bone pain: Using Oxycodone as needed.  9. Thrombocytopenia. No active bleeding. No platelet transfusion is indicated. Discussed bleeding precautions. He is currently taking Aleve once a day. I have instructed the patient to avoid ASA, ibuprofen, and Aleve. He may use Tylenol as needed.   10. Constipation. Increase Miralax to BID. Continue stool softeners.   11. Follow-up. In 2 weeks to check his hemoglobin again to see if he needs another transfusion.  12. Prognosis: I have continued to address his  prognosis with him and he understands he has a poor prognosis but we will continue with the current management approach.   Patient seen and examined today personally. He continues to report decline in his overall performance status and health. His pain has been under reasonable control but his appetite is poor continues to spend more time in chair and bed.  On physical examination today, he appeared to lethargic but responsive and oriented. Chronically ill-appearing today with the normal heart and lung examination. He has no lower extremity edema.  His laboratory data are reviewed today and shows severe myelosuppression related to his cancer and cancer treatment.  The plan at this point to continue with supportive measures and proceed with her last treatment of Xofigo. I anticipate he will roll in hospice in the near future. I will swelling the discussion with the patient and his wife regarding prognosis. His wife understands that he has very limited life expectancy probably 6 months or less.  [facility] MD 02/13/2014

## 2014-02-14 LAB — TYPE AND SCREEN
ABO/RH(D): O NEG
Antibody Screen: NEGATIVE
UNIT DIVISION: 0
UNIT DIVISION: 0

## 2014-02-16 ENCOUNTER — Telehealth: Payer: Self-pay | Admitting: *Deleted

## 2014-02-16 ENCOUNTER — Encounter: Payer: Self-pay | Admitting: Radiation Oncology

## 2014-02-16 NOTE — Telephone Encounter (Signed)
Wife calling to report that ems was called to the home at 5:00 am today. Patient up to the BR abd fell. bp low 90/40 sitting. Patient does not take any bp medication. No fractures. Note to dr Alen Blew.

## 2014-02-16 NOTE — Telephone Encounter (Signed)
CALLED PATIENT TO INFORM OF XOFIGO INJECTION, LABS AND WEIGHT, SPOKE WITH PATIENT'S WIFE AND SHE IS AWARE OF THESE APPTS.

## 2014-02-17 ENCOUNTER — Encounter: Payer: Self-pay | Admitting: Radiation Oncology

## 2014-02-19 ENCOUNTER — Telehealth: Payer: Self-pay | Admitting: *Deleted

## 2014-02-19 NOTE — Telephone Encounter (Signed)
Aaron Schaefer reports trying to get a nurse to home of Aaron Schaefer for evaluate and admit for pain management.  Call from family and family friend that is a nurse reporting patient is transitioning.  Did not admit patient last week due to possibility of receiving chemotherapy.  Today report he is clammy with pain ten out of ten pain level scale and passing black stools.  Will notify providers.

## 2014-02-19 NOTE — Telephone Encounter (Signed)
rec'd call from yvette, [facility]. States appt to do patient assessment was for 3:00 pm today. Patient expired at 2:40 pm. All appts removed from epic. Note to dr Hazeline Junker desk.

## 2014-02-27 ENCOUNTER — Other Ambulatory Visit: Payer: Medicare Other

## 2014-03-02 ENCOUNTER — Ambulatory Visit (HOSPITAL_COMMUNITY): Payer: Medicare Other

## 2014-03-11 DEATH — deceased

## 2014-03-12 ENCOUNTER — Ambulatory Visit: Payer: Medicare Other | Admitting: Oncology

## 2014-03-12 ENCOUNTER — Other Ambulatory Visit: Payer: Medicare Other

## 2014-03-13 ENCOUNTER — Other Ambulatory Visit: Payer: Medicare Other

## 2014-03-18 IMAGING — CR DG SCAPULA*R*
2 series · 2 of 2 positions shown · non-contrast
Comparison: None.

CLINICAL DATA: History of metastatic prostate malignancy and pain
since recent fall

EXAM:
RIGHT SCAPULA - 2+ VIEWS

[w scapula ap right]
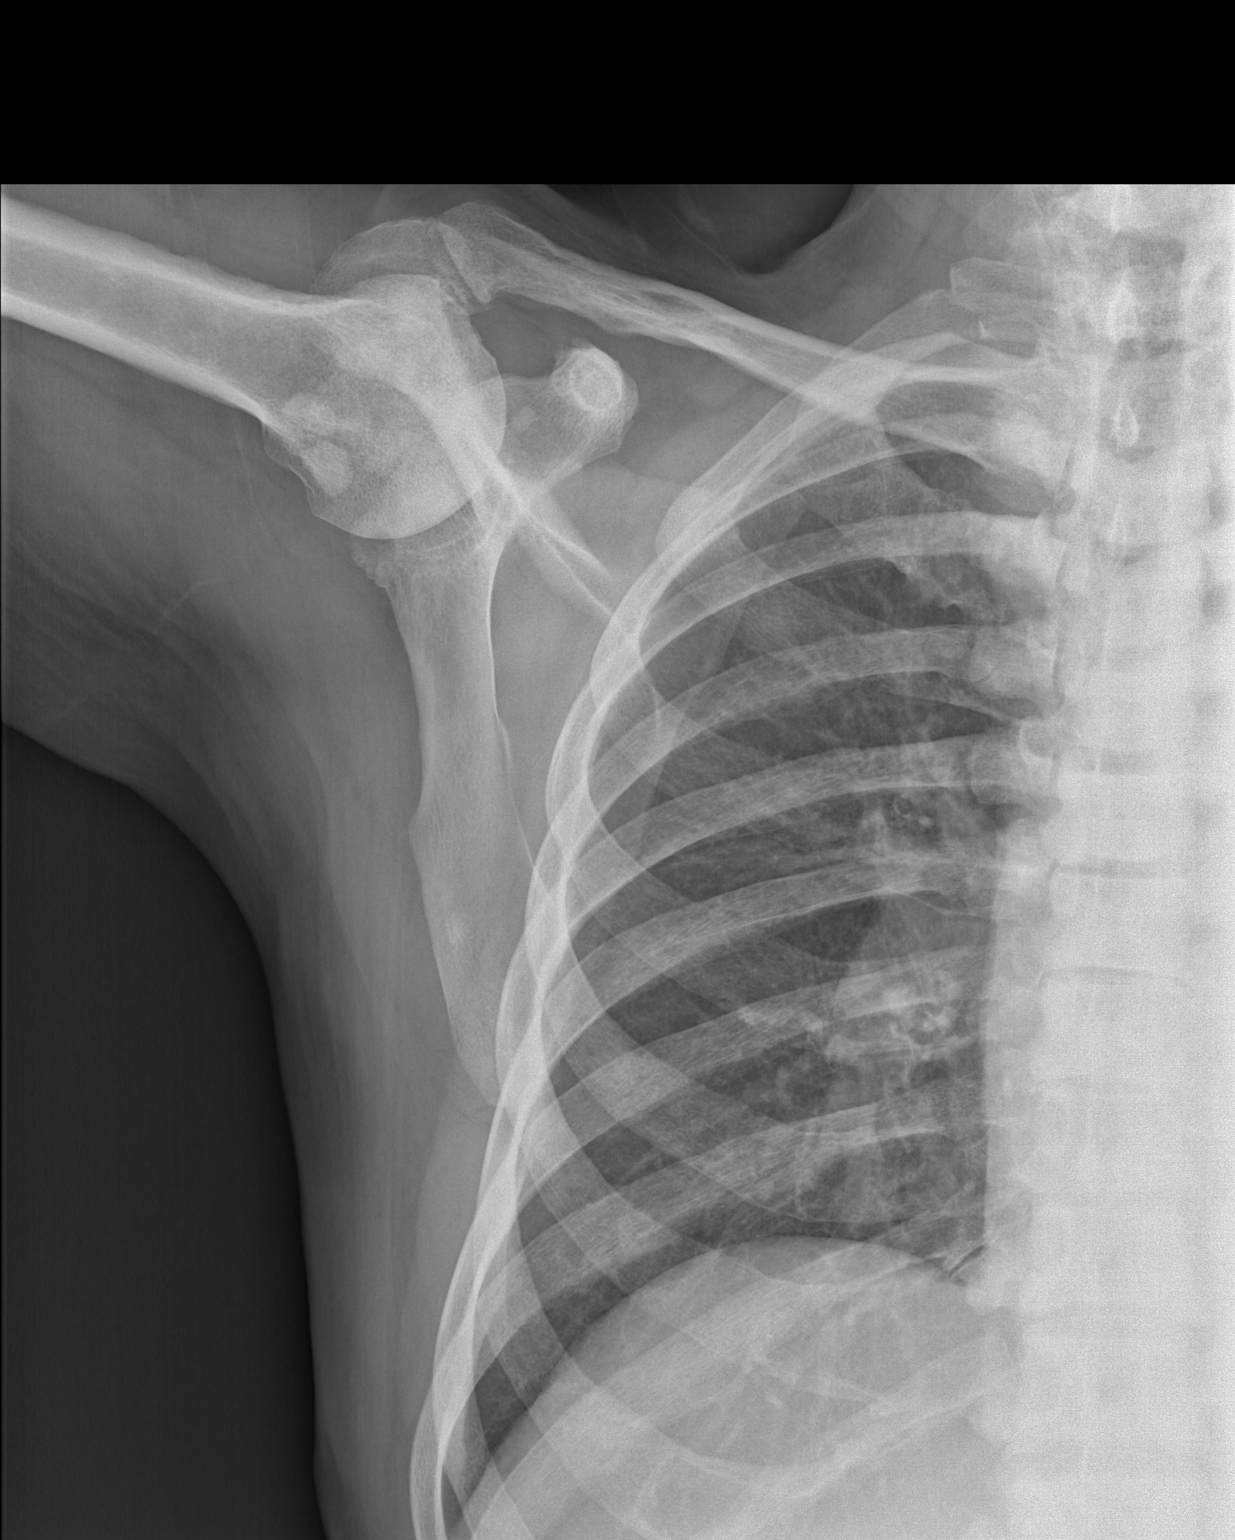

[w scapula y-view right]
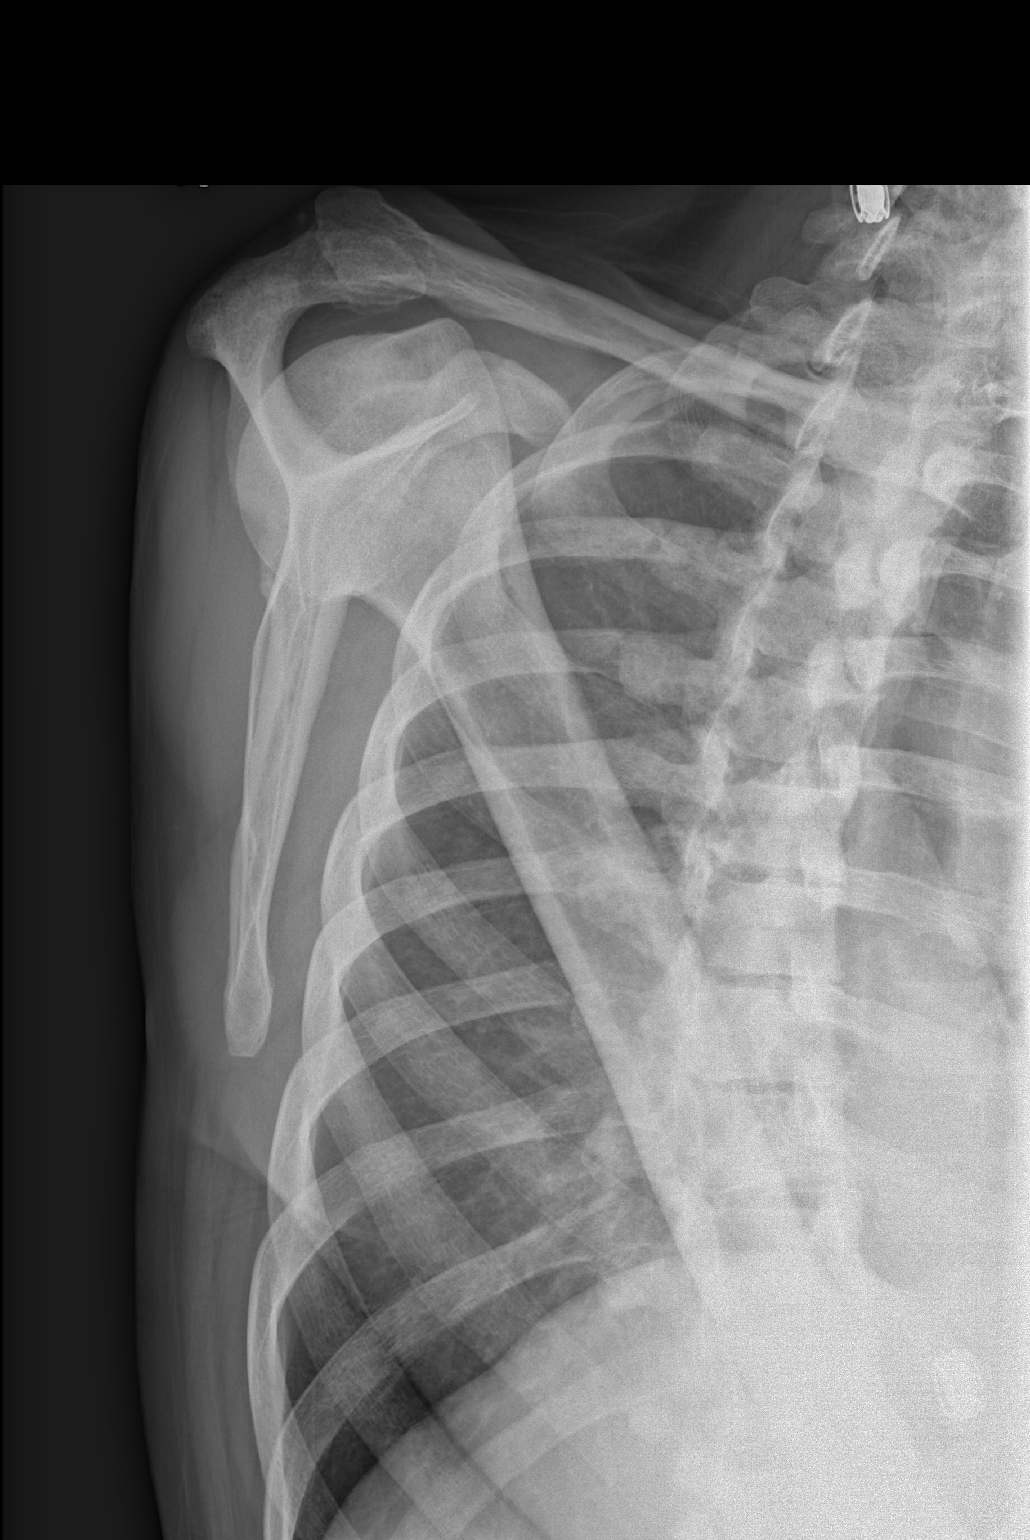

[2 of 2 positions shown; findings below may reference images not displayed]

FINDINGS: The scapula appears reasonably well mineralized. There is a
sclerotic focus at the base of the coracoid measuring approximately
4 x 5 mm. There are sclerotic foci within the humeral head. The
clavicle appears intact.
IMPRESSION: There is no acute fracture demonstrated. There are findings
consistent with metastatic disease to the scapula and humeral head.

## 2014-03-18 IMAGING — CR DG CHEST 2V
2 series · 2 of 2 positions shown · non-contrast
Comparison: Chest x-ray dated December 15, 2012.

CLINICAL DATA: Anterior right chest pain and scapular pain status
post a fall approximately 2 weeks ago.

EXAM:
CHEST  2 VIEW

[w chest pa]
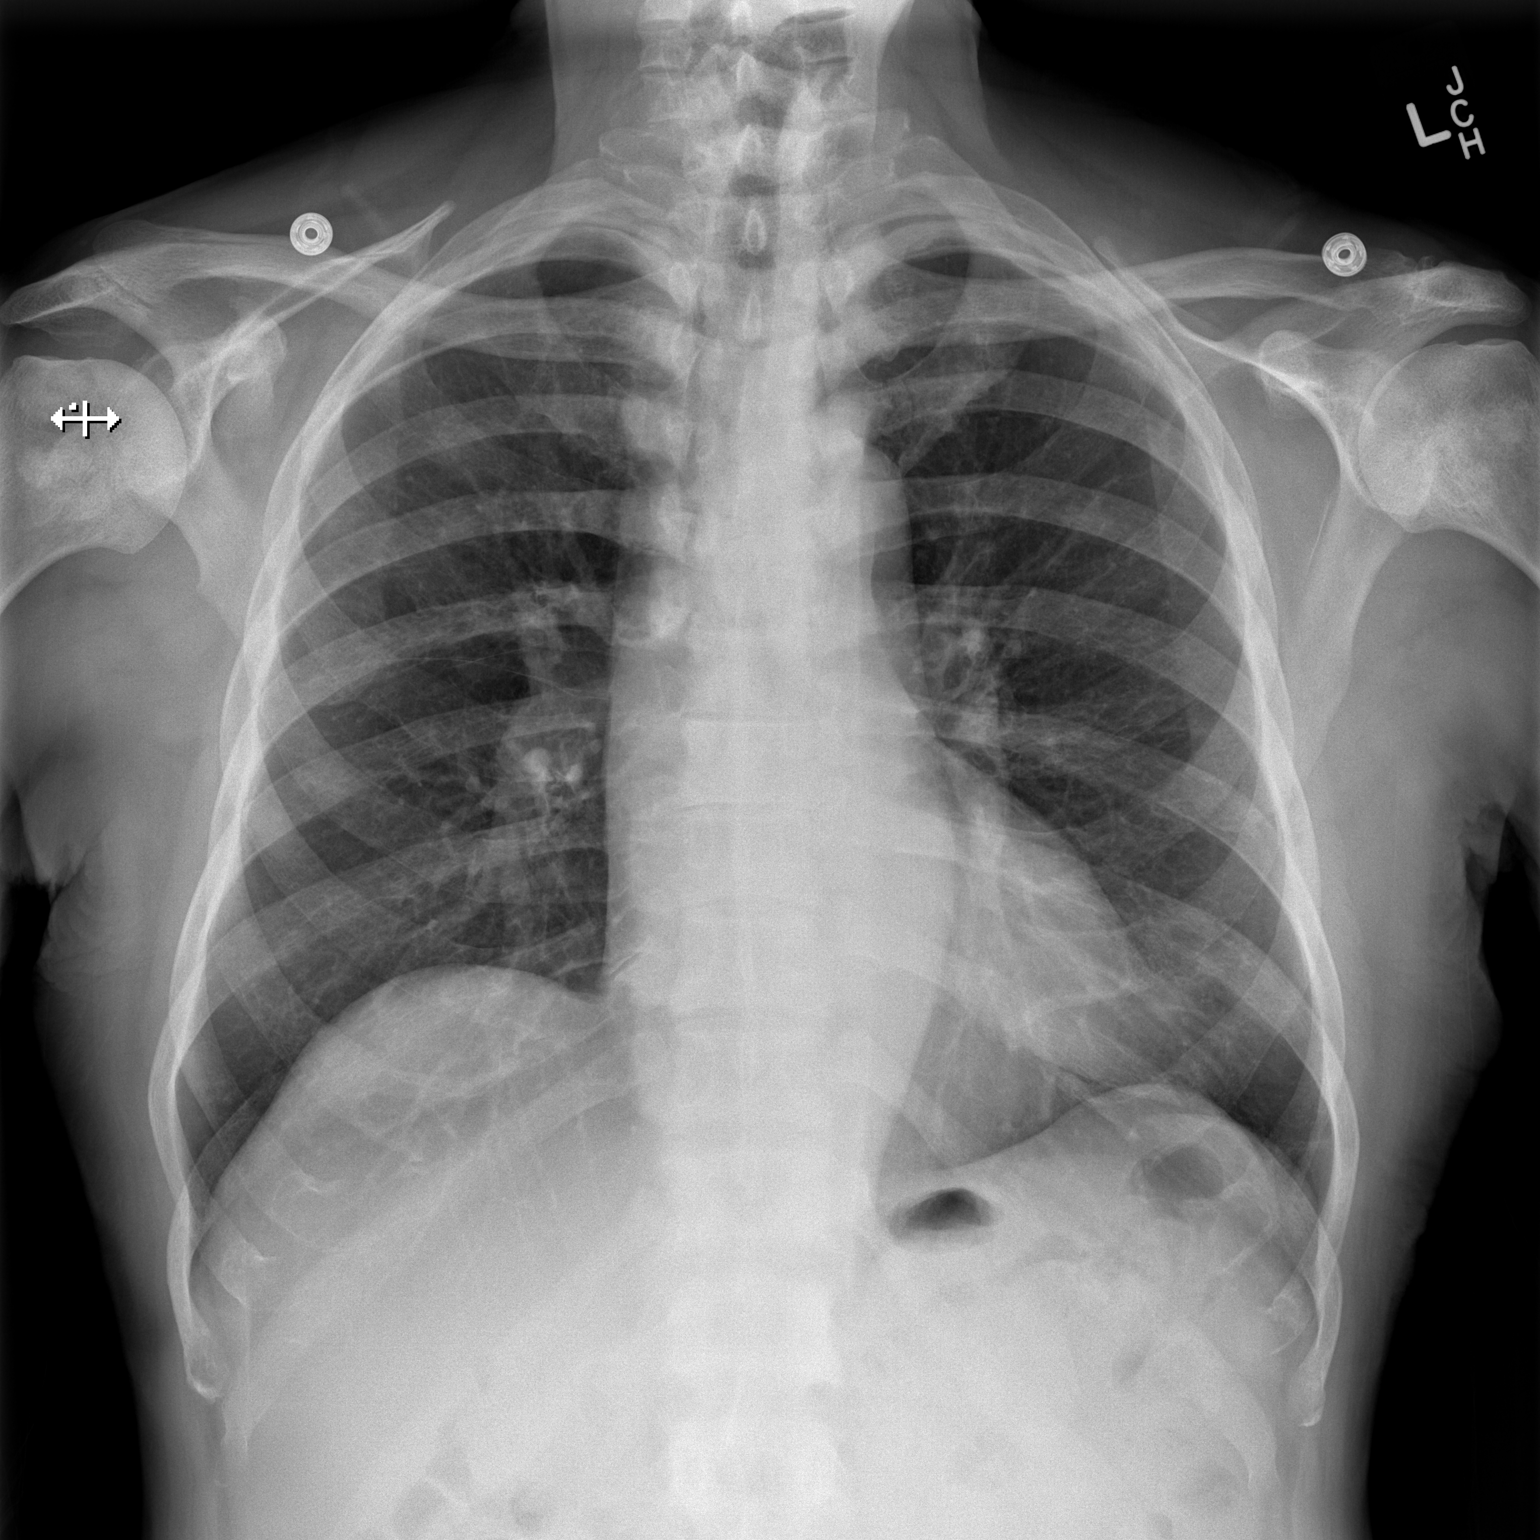

[w chest lat]
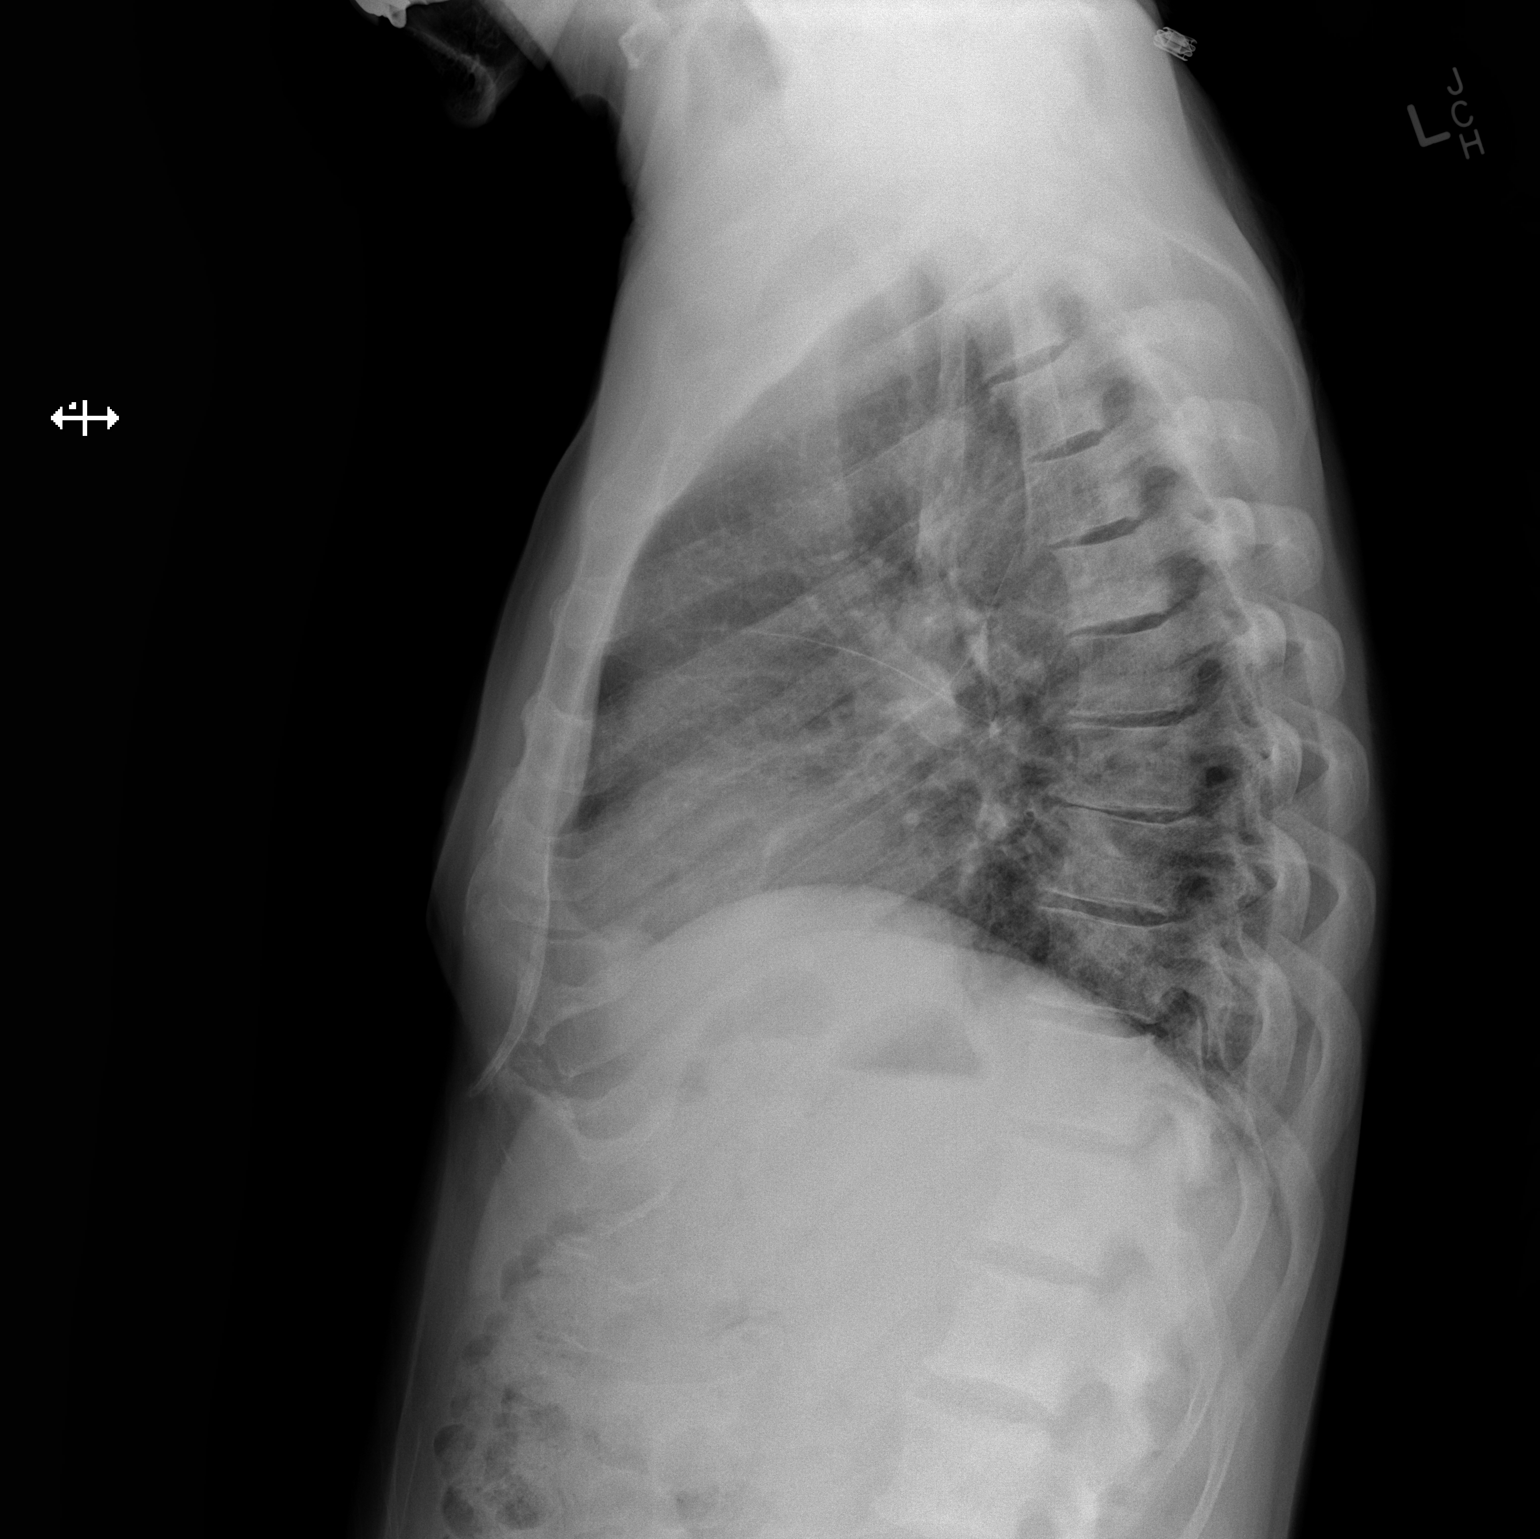

[2 of 2 positions shown; findings below may reference images not displayed]

FINDINGS: The lungs are adequately inflated. There is no focal infiltrate.
There are coarse lung markings in the left lower lobe posteriorly
which are not entirely new. The cardiac silhouette is normal in
size. The pulmonary vascularity is not engorged. The mediastinum is
normal in width. There is mild tortuosity of the descending thoracic
aorta. There is no evidence of a pneumothorax nor pneumomediastinum.
There is degenerative disc change at multiple thoracic levels and
there is sclerosis of multiple thoracic vertebral bodies due to
known metastatic prostate malignancy. No definite acute rib fracture
is demonstrated.
IMPRESSION: 1. There is no evidence of a pulmonary contusion or pneumonia nor
CHF.
2. As best as can be determined no acute rib fracture is present.
3. The observed portions of the right scapula appear normal. There
are sclerotic foci within the humeral head consistent with
metastatic disease. There are findings consistent with metastatic
disease to the spine.

## 2015-06-07 ENCOUNTER — Other Ambulatory Visit: Payer: Self-pay

## 2016-12-02 ENCOUNTER — Other Ambulatory Visit: Payer: Self-pay | Admitting: Nurse Practitioner
